# Patient Record
Sex: Male | Born: 1965 | Race: White | Hispanic: No | Marital: Married | Smoking: Never smoker
Health system: Southern US, Community
[De-identification: ages and names within clinical notes are randomized; demographics above are authoritative.]

## PROBLEM LIST (undated history)

## (undated) DIAGNOSIS — Z87442 Personal history of urinary calculi: Secondary | ICD-10-CM

## (undated) DIAGNOSIS — E119 Type 2 diabetes mellitus without complications: Secondary | ICD-10-CM

## (undated) DIAGNOSIS — J189 Pneumonia, unspecified organism: Secondary | ICD-10-CM

## (undated) DIAGNOSIS — M1990 Unspecified osteoarthritis, unspecified site: Secondary | ICD-10-CM

## (undated) DIAGNOSIS — R011 Cardiac murmur, unspecified: Secondary | ICD-10-CM

## (undated) DIAGNOSIS — K429 Umbilical hernia without obstruction or gangrene: Secondary | ICD-10-CM

## (undated) DIAGNOSIS — G4730 Sleep apnea, unspecified: Secondary | ICD-10-CM

## (undated) DIAGNOSIS — C801 Malignant (primary) neoplasm, unspecified: Secondary | ICD-10-CM

## (undated) HISTORY — PX: TONSILLECTOMY: SUR1361

## (undated) HISTORY — DX: Sleep apnea, unspecified: G47.30

## (undated) HISTORY — PX: OTHER SURGICAL HISTORY: SHX169

## (undated) HISTORY — PX: BACK SURGERY: SHX140

---

## 2000-08-11 ENCOUNTER — Encounter: Payer: Self-pay | Admitting: Emergency Medicine

## 2000-08-11 ENCOUNTER — Emergency Department (HOSPITAL_COMMUNITY): Admission: EM | Admit: 2000-08-11 | Discharge: 2000-08-11 | Payer: Self-pay | Admitting: Emergency Medicine

## 2000-10-23 ENCOUNTER — Encounter: Payer: Self-pay | Admitting: Emergency Medicine

## 2000-10-23 ENCOUNTER — Inpatient Hospital Stay (HOSPITAL_COMMUNITY): Admission: EM | Admit: 2000-10-23 | Discharge: 2000-10-24 | Payer: Self-pay | Admitting: Emergency Medicine

## 2000-10-24 ENCOUNTER — Encounter: Payer: Self-pay | Admitting: Cardiovascular Disease

## 2001-04-12 ENCOUNTER — Emergency Department (HOSPITAL_COMMUNITY): Admission: EM | Admit: 2001-04-12 | Discharge: 2001-04-12 | Payer: Self-pay | Admitting: Emergency Medicine

## 2001-08-06 ENCOUNTER — Ambulatory Visit (HOSPITAL_COMMUNITY): Admission: RE | Admit: 2001-08-06 | Discharge: 2001-08-06 | Payer: Self-pay | Admitting: Internal Medicine

## 2002-06-03 ENCOUNTER — Encounter: Payer: Self-pay | Admitting: Unknown Physician Specialty

## 2002-06-03 ENCOUNTER — Ambulatory Visit (HOSPITAL_COMMUNITY): Admission: RE | Admit: 2002-06-03 | Discharge: 2002-06-03 | Payer: Self-pay | Admitting: Unknown Physician Specialty

## 2002-11-13 ENCOUNTER — Ambulatory Visit: Admission: RE | Admit: 2002-11-13 | Discharge: 2002-11-13 | Payer: Self-pay | Admitting: Otolaryngology

## 2006-09-06 ENCOUNTER — Ambulatory Visit (HOSPITAL_COMMUNITY): Admission: RE | Admit: 2006-09-06 | Discharge: 2006-09-06 | Payer: Self-pay | Admitting: Obstetrics & Gynecology

## 2010-06-09 NOTE — Discharge Summary (Signed)
[facility]  Patient:    Brian Melton, Brian Melton Visit Number: [id number] MRN: [id number]          Service Type: MED Location: 2 A202 01 Attending Physician:  Fredirick Maudlin Dictated by:   Carylon Perches, M.D. Admit Date:  10/23/2000 Discharge Date: 10/24/2000   CC:         [name], M.D.  Kari Baars, M.D.   Discharge Summary  DISCHARGE DIAGNOSES: 1. Chest pain. 2. History of pneumothorax.  HOSPITAL COURSE:  This patient is a 45 year old white male who was admitted with chest pain.  His initial CPK was 101.  His troponin was 0.01.  His chest x-ray revealed minimal atelectasis in the left base.  He underwent a CT scan of the chest which revealed no evidence of pulmonary embolus.  He also had a CT scan of the lower extremities revealing no evidence of DVT.  Serial cardiac enzymes were normal.  He was seen in cardiology consultation by Dr. Alanda Amass. His pain was felt to be noncardiac in origin.  He was treated with Vioxx 25 mg q.d. and enteric-coated aspirin 325 mg q.d.  He was to have follow-up with Dr. Juanetta Gosling on an as needed basis. Dictated by:   Carylon Perches, M.D. Attending Physician:  Fredirick Maudlin DD:  02/08/01 TD:  02/10/01 Job: [id number]

## 2010-06-09 NOTE — H&P (Signed)
[facility]  Patient:    Brian Melton, Brian Melton Visit Number: [id number] MRN: [id number]          Service Type: MED Location: 2 A202 01 Attending Physician:  Fredirick Maudlin Dictated by:   [name], M.D. Admit Date:  10/23/2000 Discharge Date: 10/24/2000   CC:         Dr. Colon Flattery, [place], [place]  Dr. Shaune Pollack, [place], [place]   History and Physical  CHIEF COMPLAINT: This 45 year old gentleman has generally been healthy.  He developed a pleuritic type chest pain starting about 3 a.m. the morning of admission.  HISTORY OF PRESENT ILLNESS: He felt short of breath and weak.  The pain the day of admission became more pleuritic.  He noted that if he would be still the pain would not bother him but if he started to exercise it would bother him.  Two days prior to this he developed a clear rhinorrhea.  His three-year-old appears to have an upper respiratory infection.  PAST MEDICAL/SURGICAL HISTORY: He has had no surgery but he did have a spontaneous pneumothorax about 15 years prior to admission.  SOCIAL HISTORY: The patient does not smoke or drink.  He has used about a can of chewing tobacco every 1-1/2 days and has done this since high school days, about 20 years ago.  Currently married, [place].  His wife is a Engineer, civil (consulting). LMD is Dr. Colon Flattery in [place], [place].  REVIEW OF SYSTEMS: He denies fever and chills.  He has had some cough recently but has not coughed up any sputum.  Denies sore throat or earache, facial pain.  PHYSICAL EXAMINATION:  GENERAL: Admission examination showed a pleasant cooperative middle-aged male, who walked into the emergency room.  He is a good historian.  His color was good.  VITAL SIGNS: Temperature 98.8 degrees, pulse 81, respiratory rate 16, blood pressure 104/63.  O2 saturation was 98% on room air.  BP checked manually was 118/78.  Respiratory rate recheck was  16.  HEENT: TMs gray.  Good light reflex.  Pharynx mildly reddened in the posterior soft palate and uvula.  Negative ______.  No maxillary or frontal sinus pain. No axillary or subclavicular adenopathy.  LUNGS: Appeared clear throughout, moving air well.  No intercostal retractions or use of accessory muscles for respiration.  HEART: Regular rhythm without murmur, rate of 80.  ABDOMEN: Soft, obese, without hepatosplenomegaly or mass.  EXTREMITIES: No edema of the ankles.  LABORATORY DATA: WBC 7300 with 75% neutrophils, 13% lymphs, H&H 15.4/43.0; MCV 82; platelet count 223,000.  CK 101, CK-MB 1.4.  Troponin 0.01.  Blood gases on room air showed a pH of 7.43, pCO2 39, pO2 72, bicarbonate 26, O2 saturation 95%.  EKG showed normal sinus rhythm, rate of 81.  CT of the chest showed no pulmonary emboli, no PVT, no pleural effusion or pneumonia.  ADMISSION DIAGNOSES:  1. Probable pleurisy.  2. Chest pain with fatigue and decreased pO2.  3. 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  4. Exogenous obesity.  PLAN: Admit to hospital on a monitor.  He will be on O2 p.r.n. and will be on Levaquin IV.  Will arrange to have pulmonary and cardiology see him tomorrow. Hopefully he will be able to be discharged home within a day or two.  I am repeating EKG and enzymes in the morning. Dictated by:   [name], M.D. Attending Physician:  Fredirick Maudlin DD:  10/23/00 TD:  10/24/00 Job: [id number]

## 2013-03-11 ENCOUNTER — Emergency Department (HOSPITAL_COMMUNITY): Payer: Worker's Compensation

## 2013-03-11 ENCOUNTER — Emergency Department (HOSPITAL_COMMUNITY)
Admission: EM | Admit: 2013-03-11 | Discharge: 2013-03-11 | Disposition: A | Discharge status: Home / Self Care | Payer: Worker's Compensation | Attending: Emergency Medicine | Admitting: Emergency Medicine

## 2013-03-11 ENCOUNTER — Encounter (HOSPITAL_COMMUNITY): Payer: Self-pay | Admitting: Emergency Medicine

## 2013-03-11 DIAGNOSIS — S20219A Contusion of unspecified front wall of thorax, initial encounter: Secondary | ICD-10-CM | POA: Insufficient documentation

## 2013-03-11 DIAGNOSIS — E119 Type 2 diabetes mellitus without complications: Secondary | ICD-10-CM | POA: Insufficient documentation

## 2013-03-11 DIAGNOSIS — Y9289 Other specified places as the place of occurrence of the external cause: Secondary | ICD-10-CM | POA: Insufficient documentation

## 2013-03-11 DIAGNOSIS — Z880 Allergy status to penicillin: Secondary | ICD-10-CM | POA: Insufficient documentation

## 2013-03-11 DIAGNOSIS — W19XXXA Unspecified fall, initial encounter: Secondary | ICD-10-CM

## 2013-03-11 DIAGNOSIS — S4980XA Other specified injuries of shoulder and upper arm, unspecified arm, initial encounter: Secondary | ICD-10-CM | POA: Insufficient documentation

## 2013-03-11 DIAGNOSIS — Y9389 Activity, other specified: Secondary | ICD-10-CM | POA: Insufficient documentation

## 2013-03-11 DIAGNOSIS — Y990 Civilian activity done for income or pay: Secondary | ICD-10-CM | POA: Insufficient documentation

## 2013-03-11 DIAGNOSIS — Z791 Long term (current) use of non-steroidal anti-inflammatories (NSAID): Secondary | ICD-10-CM | POA: Insufficient documentation

## 2013-03-11 DIAGNOSIS — R296 Repeated falls: Secondary | ICD-10-CM | POA: Insufficient documentation

## 2013-03-11 DIAGNOSIS — S46909A Unspecified injury of unspecified muscle, fascia and tendon at shoulder and upper arm level, unspecified arm, initial encounter: Secondary | ICD-10-CM | POA: Insufficient documentation

## 2013-03-11 DIAGNOSIS — Z79899 Other long term (current) drug therapy: Secondary | ICD-10-CM | POA: Insufficient documentation

## 2013-03-11 HISTORY — DX: Type 2 diabetes mellitus without complications: E11.9

## 2013-03-11 MED ORDER — OXYCODONE-ACETAMINOPHEN 5-325 MG PO TABS: 1.0000 | ORAL_TABLET | ORAL | Status: DC | PRN

## 2013-03-11 MED ORDER — IBUPROFEN 800 MG PO TABS: 800.0000 mg | ORAL_TABLET | Freq: Three times a day (TID) | ORAL | Status: DC | PRN

## 2013-03-11 MED ORDER — OXYCODONE-ACETAMINOPHEN 5-325 MG PO TABS
1.0000 | ORAL_TABLET | Freq: Once | ORAL | Status: AC
  Administered 2013-03-11: 1 via ORAL
  Filled 2013-03-11: qty 1

## 2013-03-11 NOTE — Discharge Instructions (Signed)
Read the information below.  Use the prescribed medication as directed.  Please discuss all new medications with your pharmacist.  Do not take additional tylenol while taking the prescribed pain medication to avoid overdose.  You may return to the Emergency Department at any time for worsening condition or any new symptoms that concern you.  If you develop worsening chest pain, shortness of breath, severe cough, fever, you pass out, or become weak or dizzy, return to the ER for a recheck.    You have been diagnosed by your caregiver as having chest wall pain. SEEK IMMEDIATE MEDICAL ATTENTION IF: You develop a fever.  Your chest pains become severe or intolerable.  You develop new, unexplained symptoms (problems).  You develop shortness of breath, nausea, vomiting, sweating or feel light headed.  You develop a new cough or you cough up blood.   Chest Contusion A chest contusion is a deep bruise on your chest area. Contusions are the result of an injury that caused bleeding under the skin. A chest contusion may involve bruising of the skin, muscles, or ribs. The contusion may turn blue, purple, or yellow. Minor injuries will give you a painless contusion, but more severe contusions may stay painful and swollen for a few weeks. CAUSES  A contusion is usually caused by a blow, trauma, or direct force to an area of the body. SYMPTOMS   Swelling and redness of the injured area.  Discoloration of the injured area.  Tenderness and soreness of the injured area.  Pain. DIAGNOSIS  The diagnosis can be made by taking a history and performing a physical exam. An X-ray, CT scan, or MRI may be needed to determine if there were any associated injuries, such as broken bones (fractures) or internal injuries. TREATMENT  Often, the best treatment for a chest contusion is resting, icing, and applying cold compresses to the injured area. Deep breathing exercises may be recommended to reduce the risk of  pneumonia. Over-the-counter medicines may also be recommended for pain control. HOME CARE INSTRUCTIONS   Put ice on the injured area.  Put ice in a plastic bag.  Place a towel between your skin and the bag.  Leave the ice on for 15-20 minutes, 03-04 times a day.  Only take over-the-counter or prescription medicines as directed by your caregiver. Your caregiver may recommend avoiding anti-inflammatory medicines (aspirin, ibuprofen, and naproxen) for 48 hours because these medicines may increase bruising.  Rest the injured area.  Perform deep-breathing exercises as directed by your caregiver.  Stop smoking if you smoke.  Do not lift objects over 5 pounds (2.3 kg) for 3 days or longer if recommended by your caregiver. SEEK IMMEDIATE MEDICAL CARE IF:   You have increased bruising or swelling.  You have pain that is getting worse.  You have difficulty breathing.  You have dizziness, weakness, or fainting.  You have blood in your urine or stool.  You cough up or vomit blood.  Your swelling or pain is not relieved with medicines. MAKE SURE YOU:   Understand these instructions.  Will watch your condition.  Will get help right away if you are not doing well or get worse. Document Released: 10/03/2000 Document Revised: 10/03/2011 Document Reviewed: 07/02/2011 [facility] Patient Information 2014 [place].

## 2013-03-11 NOTE — ED Notes (Signed)
Pt fell from standing position while at work. C/o left shoulder pain but predominately left chest pain with movement.

## 2013-03-11 NOTE — ED Notes (Signed)
Pt transported to xray 

## 2013-03-11 NOTE — ED Provider Notes (Signed)
CSN: [id number]     Arrival date & time 03/11/13  1259 History  This chart was scribed for non-physician practitioner, Clayton Bibles, PA-C working with Hoy Morn, MD by Frederich Balding, ED scribe. This patient was seen in room TR08C/TR08C and the patient's care was started at 3:52 PM.    Chief Complaint  Patient presents with  . Fall   The history is provided by the patient. No language interpreter was used.   HPI Comments: Brian Melton is a 48 y.o. male who presents to the Emergency Department complaining of a fall that occurred earlier today while he was at work. He states he got caught in plastic and fell from a standing position. Denies hitting his head or LOC. He has sudden onset left chest wall pain and left shoulder pain. Movement and deep breathing worsen the pain.  Denies weakness or numbness of his arm.  Denies cough or hemoptysis.  Denies other injury or pain elsewhere, including his neck or back.    Past Medical History  Diagnosis Date  . Diabetes mellitus without complication    History reviewed. No pertinent past surgical history. No family history on file. History  Substance Use Topics  . Smoking status: Never Smoker   . Smokeless tobacco: Not on file  . Alcohol Use: No    Review of Systems  Respiratory: Negative for cough and shortness of breath.   Cardiovascular: Positive for chest pain.  Gastrointestinal: Negative for abdominal pain.  Musculoskeletal: Positive for arthralgias. Negative for back pain, neck pain and neck stiffness.  Skin: Negative for color change, pallor and wound.  Allergic/Immunologic: Negative for immunocompromised state.  Neurological: Negative for syncope, weakness, numbness and headaches.  Hematological: Does not bruise/bleed easily.  Psychiatric/Behavioral: Negative for confusion.  All other systems reviewed and are negative.   Allergies  Penicillins  Home Medications   Current Outpatient Rx  Name  Route  Sig  Dispense  Refill   . atorvastatin (LIPITOR) 10 MG tablet   Oral   Take 10 mg by mouth daily.         . Canagliflozin (INVOKANA) 300 MG TABS   Oral   Take 300 mg by mouth daily.         . fenofibrate micronized (ANTARA) 130 MG capsule   Oral   Take 130 mg by mouth daily before breakfast.         . glipiZIDE (GLUCOTROL XL) 5 MG 24 hr tablet   Oral   Take 5 mg by mouth daily with breakfast.         . meloxicam (MOBIC) 7.5 MG tablet   Oral   Take 7.5 mg by mouth 2 (two) times daily.         . metFORMIN (GLUCOPHAGE-XR) 500 MG 24 hr tablet   Oral   Take 1,000 mg by mouth 2 (two) times daily.         . Omega-3 Fatty Acids (FISH OIL) 1000 MG CAPS   Oral   Take 1 capsule by mouth 2 (two) times daily.          BP 134/86  Pulse 76  Temp(Src) 98.1 F (36.7 C) (Oral)  Resp 20  SpO2 93%  Physical Exam  Nursing note and vitals reviewed. Constitutional: He appears well-developed and well-nourished. No distress.  HENT:  Head: Normocephalic and atraumatic.  Neck: Neck supple.  Cardiovascular: Normal rate, regular rhythm and normal heart sounds.   Pulmonary/Chest: Effort normal and breath sounds normal. No  respiratory distress. He has no wheezes. He has no rales. He exhibits tenderness.  Left chest wall tenderness. No ecchymosis, skin changes or crepitus.   Musculoskeletal:  Spine nontender, no crepitus, or stepoffs. Non tender AC joint. No clavicular tenderness. No tenderness throughout left arm. 5/5 grip strength. Sensation intact. Radial pulses intact.   Neurological: He is alert.  Skin: He is not diaphoretic.  Psychiatric: He has a normal mood and affect. His behavior is normal.    ED Course  Procedures (including critical care time)  DIAGNOSTIC STUDIES: Oxygen Saturation is 93% on RA, adequate by my interpretation.    COORDINATION OF CARE: 3:57 PM-Discussed treatment plan which includes pain medication and taking deep breaths to expand his lungs with pt at bedside and pt  agreed to plan.   Labs Review Labs Reviewed - No data to display Imaging Review Dg Chest 2 View  03/11/2013   CLINICAL DATA:  Recent traumatic injury with pain  EXAM: CHEST  2 VIEW  COMPARISON:  None.  FINDINGS: Cardiac shadow is within normal limits. The lungs are well aerated bilaterally. No focal confluent infiltrate is seen. No acute bony abnormality is noted.  IMPRESSION: No active cardiopulmonary disease.   Electronically Signed   By: Inez Catalina M.D.   On: 03/11/2013 13:53   Dg Shoulder Left  03/11/2013   CLINICAL DATA:  Fall with left-sided shoulder pain.  EXAM: LEFT SHOULDER - 2+ VIEW  COMPARISON:  None.  FINDINGS: There is no evidence of fracture or dislocation. There is no evidence of arthropathy or other focal bone abnormality. Soft tissues are unremarkable.  IMPRESSION: Negative.   Electronically Signed   By: Aletta Edouard M.D.   On: 03/11/2013 13:50    EKG Interpretation   None       MDM   Final diagnoses:  Fall  Chest wall contusion   Pt with mechanical fall with pain and tenderness to left chest wall.  No LOC.  No SOB.  Pain is reproducible with movement of left arm, with deep breath, and especially with palpation.   Pt offered sling for comfort only, advised to use it only as needed.  No rib fractures identified on CXR.  Likely rib contusion.  Discussed pain control, deep inspiration and coughing throughout the day to prevent pneumonia.   D/C home with percocet, PCP follow up.  Discussed result, findings, treatment, and follow up  with patient.  Pt given return precautions.  Pt verbalizes understanding and agrees with plan.      I personally performed the services described in this documentation, which was scribed in my presence. The recorded information has been reviewed and is accurate.   [place], PA-C 03/11/13 [phone]

## 2013-03-11 NOTE — ED Notes (Signed)
Pt reports fell while at work, brought in by EMS. Pt c/o pain to left side and left shoulder. No LOC, no blood thinners.

## 2013-03-11 NOTE — ED Provider Notes (Signed)
Medical screening examination/treatment/procedure(s) were performed by non-physician practitioner and as supervising physician I was immediately available for consultation/collaboration.  EKG Interpretation   None         Hoy Morn, MD 03/11/13 [phone]

## 2013-10-16 ENCOUNTER — Emergency Department (HOSPITAL_COMMUNITY)
Admission: EM | Admit: 2013-10-16 | Discharge: 2013-10-16 | Disposition: A | Discharge status: Home / Self Care | Payer: BC Managed Care – PPO | Attending: Emergency Medicine | Admitting: Emergency Medicine

## 2013-10-16 ENCOUNTER — Emergency Department (HOSPITAL_COMMUNITY): Payer: BC Managed Care – PPO

## 2013-10-16 ENCOUNTER — Encounter (HOSPITAL_COMMUNITY): Payer: Self-pay | Admitting: Emergency Medicine

## 2013-10-16 DIAGNOSIS — N23 Unspecified renal colic: Secondary | ICD-10-CM

## 2013-10-16 DIAGNOSIS — Z791 Long term (current) use of non-steroidal anti-inflammatories (NSAID): Secondary | ICD-10-CM | POA: Insufficient documentation

## 2013-10-16 DIAGNOSIS — R109 Unspecified abdominal pain: Secondary | ICD-10-CM | POA: Insufficient documentation

## 2013-10-16 DIAGNOSIS — Z880 Allergy status to penicillin: Secondary | ICD-10-CM | POA: Insufficient documentation

## 2013-10-16 DIAGNOSIS — E119 Type 2 diabetes mellitus without complications: Secondary | ICD-10-CM | POA: Insufficient documentation

## 2013-10-16 DIAGNOSIS — Z79899 Other long term (current) drug therapy: Secondary | ICD-10-CM | POA: Insufficient documentation

## 2013-10-16 DIAGNOSIS — R112 Nausea with vomiting, unspecified: Secondary | ICD-10-CM | POA: Diagnosis not present

## 2013-10-16 DIAGNOSIS — R300 Dysuria: Secondary | ICD-10-CM | POA: Insufficient documentation

## 2013-10-16 LAB — URINALYSIS, ROUTINE W REFLEX MICROSCOPIC
Bilirubin Urine: NEGATIVE
Glucose, UA: NEGATIVE mg/dL
Ketones, ur: NEGATIVE mg/dL
Leukocytes, UA: NEGATIVE
NITRITE: NEGATIVE
PROTEIN: NEGATIVE mg/dL
SPECIFIC GRAVITY, URINE: 1.015 (ref 1.005–1.030)
Urobilinogen, UA: 0.2 mg/dL (ref 0.0–1.0)
pH: 5.5 (ref 5.0–8.0)

## 2013-10-16 LAB — URINE MICROSCOPIC-ADD ON

## 2013-10-16 MED ORDER — ONDANSETRON HCL 4 MG/2ML IJ SOLN
4.0000 mg | Freq: Once | INTRAMUSCULAR | Status: AC
  Administered 2013-10-16: 4 mg via INTRAVENOUS
  Filled 2013-10-16: qty 2

## 2013-10-16 MED ORDER — SODIUM CHLORIDE 0.9 % IV SOLN
1000.0000 mL | Freq: Once | INTRAVENOUS | Status: AC
  Administered 2013-10-16: 1000 mL via INTRAVENOUS

## 2013-10-16 MED ORDER — HYDROCODONE-ACETAMINOPHEN 5-325 MG PO TABS: 1.0000 | ORAL_TABLET | ORAL | Status: DC | PRN

## 2013-10-16 MED ORDER — KETOROLAC TROMETHAMINE 30 MG/ML IJ SOLN
30.0000 mg | Freq: Once | INTRAMUSCULAR | Status: AC
  Administered 2013-10-16: 30 mg via INTRAVENOUS
  Filled 2013-10-16: qty 1

## 2013-10-16 MED ORDER — ONDANSETRON HCL 4 MG PO TABS: 4.0000 mg | ORAL_TABLET | Freq: Four times a day (QID) | ORAL | Status: DC

## 2013-10-16 MED ORDER — ONDANSETRON HCL 4 MG/2ML IJ SOLN: 4.0000 mg | Freq: Once | INTRAMUSCULAR | Status: DC

## 2013-10-16 MED ORDER — SODIUM CHLORIDE 0.9 % IV SOLN: 1000.0000 mL | INTRAVENOUS | Status: DC

## 2013-10-16 NOTE — ED Provider Notes (Signed)
CSN: [id number]     Arrival date & time 10/16/13  7829 History   First MD Initiated Contact with Patient 10/16/13 [phone]     Chief Complaint  Patient presents with  . Flank Pain     (Consider location/radiation/quality/duration/timing/severity/associated sxs/prior Treatment) Patient is a 48 y.o. male presenting with flank pain. The history is provided by the patient. No language interpreter was used.  Flank Pain This is a new problem. The current episode started today. Associated symptoms include abdominal pain, nausea and vomiting. Pertinent negatives include no chest pain, chills, coughing, fever or myalgias. Associated symptoms comments: Left flank pain that started this morning similar to history of kidney stones. Last stone was about 20 years ago which he was able to pass without intervention. He has nausea without vomiting. No fever. He denies hematuria. He reports some burning with urination last night, none today..    Past Medical History  Diagnosis Date  . Diabetes mellitus without complication    History reviewed. No pertinent past surgical history. No family history on file. History  Substance Use Topics  . Smoking status: Never Smoker   . Smokeless tobacco: Not on file  . Alcohol Use: No    Review of Systems  Constitutional: Negative for fever and chills.  Respiratory: Negative.  Negative for cough.   Cardiovascular: Negative.  Negative for chest pain.  Gastrointestinal: Positive for nausea, vomiting and abdominal pain.  Genitourinary: Positive for dysuria and flank pain. Negative for hematuria.  Musculoskeletal: Negative.  Negative for myalgias.  Skin: Negative.  Negative for color change.  Neurological: Negative.       Allergies  Penicillins  Home Medications   Prior to Admission medications   Medication Sig Start Date End Date Taking? Authorizing Provider  atorvastatin (LIPITOR) 10 MG tablet Take 10 mg by mouth daily.   Yes Historical Provider, MD   fenofibrate micronized (ANTARA) 130 MG capsule Take 130 mg by mouth daily before breakfast.   Yes Historical Provider, MD  glipiZIDE (GLUCOTROL XL) 5 MG 24 hr tablet Take 5 mg by mouth daily with breakfast.   Yes Historical Provider, MD  ibuprofen (ADVIL,MOTRIN) 200 MG tablet Take 600 mg by mouth daily as needed for moderate pain.   Yes Historical Provider, MD  lisinopril (PRINIVIL,ZESTRIL) 10 MG tablet Take 1 tablet by mouth daily. 10/03/13  Yes Historical Provider, MD  meloxicam (MOBIC) 7.5 MG tablet Take 7.5 mg by mouth 2 (two) times daily.   Yes Historical Provider, MD  metFORMIN (GLUCOPHAGE-XR) 500 MG 24 hr tablet Take 1,000 mg by mouth 2 (two) times daily.   Yes Historical Provider, MD  Omega-3 Fatty Acids (FISH OIL) 1000 MG CAPS Take 1 capsule by mouth 2 (two) times daily.   Yes Historical Provider, MD   BP 141/97  Pulse 64  Temp(Src) 97.6 F (36.4 C) (Oral)  Resp 18  SpO2 100% Physical Exam  Constitutional: He is oriented to person, place, and time. He appears well-developed and well-nourished. No distress.  HENT:  Head: Normocephalic.  Neck: Normal range of motion. Neck supple.  Cardiovascular: Normal rate and regular rhythm.   Pulmonary/Chest: Effort normal and breath sounds normal.  Abdominal: Soft. Bowel sounds are normal. There is no tenderness. There is no rebound and no guarding.  Genitourinary:  No CVA tenderness.   Musculoskeletal: Normal range of motion.  Neurological: He is alert and oriented to person, place, and time.  Skin: Skin is warm and dry. No rash noted.  Psychiatric: He has a normal  mood and affect.    ED Course  Procedures (including critical care time) Labs Review Labs Reviewed  URINALYSIS, ROUTINE W REFLEX MICROSCOPIC - Abnormal; Notable for the following:    Hgb urine dipstick TRACE (*)    All other components within normal limits  URINE MICROSCOPIC-ADD ON    Imaging Review No results found.   EKG Interpretation None      MDM   Final  diagnoses:  None    1. Ureteral colic  No stone visualized on CT, however, there is stranding suggesting passed stone. He is pain free now with Toradol. No further nausea. No evidence of UTI, doubt pyelonephritis. He is well appearing, afebrile. Discussed return precautions.     Dewaine Oats, PA-C 10/16/13 1123

## 2013-10-16 NOTE — ED Provider Notes (Signed)
Medical screening examination/treatment/procedure(s) were performed by non-physician practitioner and as supervising physician I was immediately available for consultation/collaboration.   EKG Interpretation None        Orpah Greek, MD 10/16/13 1139

## 2013-10-16 NOTE — ED Notes (Signed)
PA at bedside.

## 2013-10-16 NOTE — ED Notes (Signed)
Left sided flank pain that began this am along with nausea.

## 2013-10-16 NOTE — Discharge Instructions (Signed)

## 2015-10-31 ENCOUNTER — Encounter (INDEPENDENT_AMBULATORY_CARE_PROVIDER_SITE_OTHER): Payer: Self-pay | Admitting: Internal Medicine

## 2015-11-10 ENCOUNTER — Ambulatory Visit (INDEPENDENT_AMBULATORY_CARE_PROVIDER_SITE_OTHER): Payer: BLUE CROSS/BLUE SHIELD | Admitting: Internal Medicine

## 2015-11-10 ENCOUNTER — Encounter (INDEPENDENT_AMBULATORY_CARE_PROVIDER_SITE_OTHER): Payer: Self-pay | Admitting: *Deleted

## 2015-11-10 ENCOUNTER — Other Ambulatory Visit (INDEPENDENT_AMBULATORY_CARE_PROVIDER_SITE_OTHER): Payer: Self-pay | Admitting: Internal Medicine

## 2015-11-10 ENCOUNTER — Telehealth (INDEPENDENT_AMBULATORY_CARE_PROVIDER_SITE_OTHER): Payer: Self-pay | Admitting: *Deleted

## 2015-11-10 ENCOUNTER — Encounter (INDEPENDENT_AMBULATORY_CARE_PROVIDER_SITE_OTHER): Payer: Self-pay | Admitting: Internal Medicine

## 2015-11-10 ENCOUNTER — Encounter (INDEPENDENT_AMBULATORY_CARE_PROVIDER_SITE_OTHER): Payer: Self-pay

## 2015-11-10 VITALS — BP 140/90 | HR 68 | Temp 97.9°F | Ht 70.0 in | Wt 248.0 lb

## 2015-11-10 DIAGNOSIS — R195 Other fecal abnormalities: Secondary | ICD-10-CM | POA: Insufficient documentation

## 2015-11-10 DIAGNOSIS — G4730 Sleep apnea, unspecified: Secondary | ICD-10-CM | POA: Insufficient documentation

## 2015-11-10 DIAGNOSIS — E119 Type 2 diabetes mellitus without complications: Secondary | ICD-10-CM | POA: Insufficient documentation

## 2015-11-10 MED ORDER — PEG 3350-KCL-NA BICARB-NACL 420 G PO SOLR: 4000.0000 mL | Freq: Once | ORAL | 0 refills | Status: AC

## 2015-11-10 NOTE — Patient Instructions (Signed)
Colonoscopy.  The risks and benefits such as perforation, bleeding, and infection were reviewed with the patient and is agreeable. 

## 2015-11-10 NOTE — Telephone Encounter (Signed)
Patient needs trilyte 

## 2015-11-10 NOTE — Progress Notes (Signed)
   Subjective:    Patient ID: Brian Melton, male    DOB: 01/27/1965, 50 y.o.   MRN: [id number]  HPI Referred by Dr. Edrick Oh for + FOB. Occasionally see [facility].  There has been weight loss which was intentional. Appetite is good. No abdominal pain.  States he has a hernia. No acid reflux. Has a BM 1-2 a day.  Metformin gives him diarrhea. Really no change in his stools.   08/07/2007 colonoscopy: elective. Post prandial diarrhea consistent with IBS: Singlesmall polyp in the sigmoid colon. Remove by snare cautery. Small external hemorrhoids were present. Biopsy: Inflammatory polyp  Diabetic x 11 yrs.  09/27/2015 H and H 14.7 and 42.7, MCV 90 Total bili 0.8, ALP 108, AST 17, ALT 28  Review of Systems Past Medical History:  Diagnosis Date  . Diabetes mellitus without complication ([place])   . Sleep apnea     Past Surgical History:  Procedure Laterality Date  . ear drum surgery     infant.  . TONSILLECTOMY      Allergies  Allergen Reactions  . Penicillins     Childhood reaction.    Current Outpatient Prescriptions on File Prior to Visit  Medication Sig Dispense Refill  . atorvastatin (LIPITOR) 10 MG tablet Take 5 mg by mouth daily.     Marland Kitchen glipiZIDE (GLUCOTROL XL) 5 MG 24 hr tablet Take 5 mg by mouth 2 (two) times daily.     Marland Kitchen ibuprofen (ADVIL,MOTRIN) 200 MG tablet Take 600 mg by mouth daily as needed for moderate pain.    . meloxicam (MOBIC) 7.5 MG tablet Take 7.5 mg by mouth 2 (two) times daily.    . metFORMIN (GLUCOPHAGE-XR) 500 MG 24 hr tablet Take 1,000 mg by mouth 2 (two) times daily.     No current facility-administered medications on file prior to visit.        Objective:   Physical Exam Blood pressure 140/90, pulse 68, temperature 97.9 F (36.6 C), height 5\' 10"  (1.778 m), weight 248 lb (112.5 kg).  Alert and oriented. Skin warm and dry. Oral mucosa is moist.   . Sclera anicteric, conjunctivae is pink. Thyroid not enlarged. No cervical lymphadenopathy. Lungs clear.  Heart regular rate and rhythm.  Abdomen is soft. Bowel sounds are positive. No hepatomegaly. No abdominal masses felt. No tenderness.  No edema to lower extremities.  .       Assessment & Plan:  +FOB. Colonic neoplasm needs to be ruled out.  Hemorrhoids, polyp, AVM, ulcer also in the differential.

## 2015-11-22 ENCOUNTER — Encounter (HOSPITAL_COMMUNITY): Payer: Self-pay

## 2015-11-22 ENCOUNTER — Ambulatory Visit (HOSPITAL_COMMUNITY)
Admission: RE | Admit: 2015-11-22 | Discharge: 2015-11-22 | Disposition: A | Discharge status: Home / Self Care | Payer: BLUE CROSS/BLUE SHIELD | Source: Ambulatory Visit | Attending: Internal Medicine | Admitting: Internal Medicine

## 2015-11-22 ENCOUNTER — Encounter (HOSPITAL_COMMUNITY)
Admission: RE | Disposition: A | Discharge status: Home / Self Care | Payer: Self-pay | Source: Ambulatory Visit | Attending: Internal Medicine

## 2015-11-22 DIAGNOSIS — G4730 Sleep apnea, unspecified: Secondary | ICD-10-CM | POA: Diagnosis not present

## 2015-11-22 DIAGNOSIS — E119 Type 2 diabetes mellitus without complications: Secondary | ICD-10-CM | POA: Diagnosis not present

## 2015-11-22 DIAGNOSIS — K644 Residual hemorrhoidal skin tags: Secondary | ICD-10-CM | POA: Insufficient documentation

## 2015-11-22 DIAGNOSIS — R195 Other fecal abnormalities: Secondary | ICD-10-CM | POA: Insufficient documentation

## 2015-11-22 DIAGNOSIS — F17290 Nicotine dependence, other tobacco product, uncomplicated: Secondary | ICD-10-CM | POA: Insufficient documentation

## 2015-11-22 DIAGNOSIS — K921 Melena: Secondary | ICD-10-CM | POA: Diagnosis present

## 2015-11-22 DIAGNOSIS — Z880 Allergy status to penicillin: Secondary | ICD-10-CM | POA: Insufficient documentation

## 2015-11-22 DIAGNOSIS — Z7984 Long term (current) use of oral hypoglycemic drugs: Secondary | ICD-10-CM | POA: Diagnosis not present

## 2015-11-22 HISTORY — PX: COLONOSCOPY: SHX5424

## 2015-11-22 LAB — GLUCOSE, CAPILLARY: Glucose-Capillary: 143 mg/dL — ABNORMAL HIGH (ref 65–99)

## 2015-11-22 SURGERY — COLONOSCOPY
Anesthesia: Moderate Sedation

## 2015-11-22 MED ORDER — MIDAZOLAM HCL 5 MG/5ML IJ SOLN
INTRAMUSCULAR | Status: AC
  Filled 2015-11-22: qty 10

## 2015-11-22 MED ORDER — MEPERIDINE HCL 50 MG/ML IJ SOLN
INTRAMUSCULAR | Status: DC | PRN
  Administered 2015-11-22 (×2): 25 mg via INTRAVENOUS

## 2015-11-22 MED ORDER — MEPERIDINE HCL 50 MG/ML IJ SOLN
INTRAMUSCULAR | Status: AC
  Filled 2015-11-22: qty 1

## 2015-11-22 MED ORDER — MIDAZOLAM HCL 5 MG/5ML IJ SOLN
INTRAMUSCULAR | Status: DC | PRN
Start: 2015-11-22 — End: 2015-11-22
  Administered 2015-11-22 (×3): 2 mg via INTRAVENOUS

## 2015-11-22 MED ORDER — STERILE WATER FOR IRRIGATION IR SOLN
Status: DC | PRN
  Administered 2015-11-22: 2.5 mL

## 2015-11-22 NOTE — Op Note (Signed)
[facility] Patient Name: Brian Melton Procedure Date: 11/22/2015 12:58 PM MRN: [id number] Date of Birth: [birth year] Attending MD: Hildred Laser , MD CSN: [id number] Age: 51 Admit Type: Outpatient Procedure:                Colonoscopy Indications:              Heme positive stool Providers:                Hildred Laser, MD, Lurline Del, RN, Charlyne Petrin                            RN, RN, Purcell Nails. Rawlins, Technician Referring MD:             Sherrie Mustache, MD Medicines:                Meperidine 50 mg IV, Midazolam 6 mg IV Complications:            No immediate complications. Estimated Blood Loss:     Estimated blood loss: none. Procedure:                Pre-Anesthesia Assessment:                           - Prior to the procedure, a History and Physical                            was performed, and patient medications and                            allergies were reviewed. The patient's tolerance of                            previous anesthesia was also reviewed. The risks                            and benefits of the procedure and the sedation                            options and risks were discussed with the patient.                            All questions were answered, and informed consent                            was obtained. Prior Anticoagulants: The patient                            last took previous NSAID medication 1 day prior to                            the procedure. ASA Grade Assessment: II - A patient                            with mild systemic disease. After reviewing the  risks and benefits, the patient was deemed in                            satisfactory condition to undergo the procedure.                           After obtaining informed consent, the colonoscope                            was passed under direct vision. Throughout the                            procedure, the patient's blood pressure, pulse,  and                            oxygen saturations were monitored continuously. The                            [id number] scope was introduced through                            the anus and advanced to the the cecum, identified                            by appendiceal orifice and ileocecal valve. The                            colonoscopy was performed without difficulty. The                            patient tolerated the procedure well. The quality                            of the bowel preparation was adequate to identify                            polyps 6 mm and larger in size. The ileocecal                            valve, appendiceal orifice, and rectum were                            photographed. Scope In: 1:11:37 PM Scope Out: 1:25:53 PM Scope Withdrawal Time: 0 hours 8 minutes 56 seconds  Total Procedure Duration: 0 hours 14 minutes 16 seconds  Findings:      The colon (entire examined portion) appeared normal.      External hemorrhoids were found during retroflexion. The hemorrhoids       were small. Impression:               - The entire examined colon is normal.                           - External hemorrhoids.                           -  No specimens collected. Moderate Sedation:      Moderate (conscious) sedation was administered by the endoscopy nurse       and supervised by the endoscopist. The following parameters were       monitored: oxygen saturation, heart rate, blood pressure, CO2       capnography and response to care. Total physician intraservice time was       23 minutes. Recommendation:           - Patient has a contact number available for                            emergencies. The signs and symptoms of potential                            delayed complications were discussed with the                            patient. Return to normal activities tomorrow.                            Written discharge instructions were provided to the                             patient.                           - Resume previous diet today.                           - Continue present medications.                           - Repeat colonoscopy in 10 years for screening                            purposes.                           - Patient advised to have H&H in 3-4 months.                           - Patient advised not to take ibuprofen while he is                            on meloxicam. Procedure Code(s):        --- Professional ---                           [phone], Colonoscopy, flexible; diagnostic, including                            collection of specimen(s) by brushing or washing,                            when performed (separate procedure)  [id number], Moderate sedation services provided by the                            same physician or other qualified health care                            professional performing the diagnostic or                            therapeutic service that the sedation supports,                            requiring the presence of an independent trained                            observer to assist in the monitoring of the                            patient's level of consciousness and physiological                            status; initial 15 minutes of intraservice time,                            patient age 55 years or older                           [phone], Moderate sedation services; each additional                            15 minutes intraservice time Diagnosis Code(s):        --- Professional ---                           K64.4, Residual hemorrhoidal skin tags                           R19.5, Other fecal abnormalities CPT copyright 2016 American Medical Association. All rights reserved. The codes documented in this report are preliminary and upon coder review may  be revised to meet current compliance requirements. Hildred Laser, MD Hildred Laser, MD 11/22/2015 1:34:25  PM This report has been signed electronically. Number of Addenda: 0

## 2015-11-22 NOTE — H&P (Signed)
Brian Melton is an 50 y.o. male.   Chief Complaint: Patient is here for colonoscopy. HPI: Patient is 50 year old Caucasian male who was noted to have heme-positive stool and is here for diagnostic colonoscopy. He denies abdominal pain change in bowel habits or rectal bleeding. He is on meloxicam and occasionally takes Advil on top of that with has not experienced any side effects such as epigastric pain nausea or vomiting. He also denies heartburn or dysphagia. 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 Family History is negative for CRC.  Past Medical History:  Diagnosis Date  . Diabetes mellitus without complication ([place])   . Sleep apnea     Past Surgical History:  Procedure Laterality Date  . ear drum surgery     infant.  . TONSILLECTOMY      History reviewed. No pertinent family history. Social History:  reports that he has never smoked. His smokeless tobacco use includes Chew. He reports that he does not drink alcohol or use drugs.  Allergies:  Allergies  Allergen Reactions  . Penicillins     Childhood reaction. Has patient had a PCN reaction causing immediate rash, facial/tongue/throat swelling, SOB or lightheadedness with hypotension: No Has patient had a PCN reaction causing severe rash involving mucus membranes or skin necrosis: No Has patient had a PCN reaction that required hospitalization No Has patient had a PCN reaction occurring within the last 10 years: No If all of the above answers are "NO", then may proceed with Cephalosporin use.     Medications Prior to Admission  Medication Sig Dispense Refill  . atorvastatin (LIPITOR) 10 MG tablet Take 5 mg by mouth daily.     . canagliflozin (INVOKANA) 300 MG TABS tablet Take 300 mg by mouth daily before breakfast.    . glipiZIDE (GLUCOTROL XL) 5 MG 24 hr tablet Take 5 mg by mouth 2 (two) times daily.     Marland Kitchen ibuprofen  (ADVIL,MOTRIN) 200 MG tablet Take 600 mg by mouth daily as needed for moderate pain.    Marland Kitchen losartan (COZAAR) 50 MG tablet Take 50 mg by mouth daily.    . meloxicam (MOBIC) 7.5 MG tablet Take 7.5 mg by mouth 2 (two) times daily.    . metFORMIN (GLUCOPHAGE-XR) 500 MG 24 hr tablet Take 1,000 mg by mouth 2 (two) times daily.    . Omega-3 Fatty Acids (FISH OIL) 1200 MG CAPS Take 1,200 mg by mouth daily.       Results for orders placed or performed during the hospital encounter of 11/22/15 (from the past 48 hour(s))  Glucose, capillary     Status: Abnormal   Collection Time: 11/22/15 11:16 AM  Result Value Ref Range   Glucose-Capillary 143 (H) 65 - 99 mg/dL   No results found.  ROS  Blood pressure 125/79, pulse 65, temperature 97.7 F (36.5 C), resp. rate 16, SpO2 97 %. Physical Exam  Constitutional: He appears well-developed and well-nourished.  HENT:  Mouth/Throat: Oropharynx is clear and moist.  Eyes: Conjunctivae are normal. No scleral icterus.  Neck: No thyromegaly present.  Cardiovascular: Normal rate, regular rhythm and normal heart sounds.   No murmur heard. Respiratory: Effort normal and breath sounds normal.  GI: Soft. He exhibits no distension and no mass. There is no tenderness.  Musculoskeletal: He exhibits no edema.  Lymphadenopathy:    He has no cervical adenopathy.  Neurological: He is alert.  Skin: Skin is warm  and dry.     Assessment/Plan Heme-positive stool. Diagnostic colonoscopy.  Hildred Laser, MD 11/22/2015, 1:00 PM

## 2015-11-22 NOTE — Discharge Instructions (Signed)
Remember not to take Advil or ibuprofen while on meloxicam. Can take Tylenol up to 2 g per day as needed. Resume other medications as before. Resume usual diet. No driving for 24 hours. Please have Dr. Edrick Oh check hemoglobin and hematocrit in 3-4 months.   Hemorrhoids Hemorrhoids are swollen veins around the rectum or anus. There are two types of hemorrhoids:   Internal hemorrhoids. These occur in the veins just inside the rectum. They may poke through to the outside and become irritated and painful.  External hemorrhoids. These occur in the veins outside the anus and can be felt as a painful swelling or hard lump near the anus. CAUSES  Pregnancy.   Obesity.   Constipation or diarrhea.   Straining to have a bowel movement.   Sitting for long periods on the toilet.  Heavy lifting or other activity that caused you to strain.  Anal intercourse. SYMPTOMS   Pain.   Anal itching or irritation.   Rectal bleeding.   Fecal leakage.   Anal swelling.   One or more lumps around the anus.  DIAGNOSIS  Your caregiver may be able to diagnose hemorrhoids by visual examination. Other examinations or tests that may be performed include:   Examination of the rectal area with a gloved hand (digital rectal exam).   Examination of anal canal using a small tube (scope).   A blood test if you have lost a significant amount of blood.  A test to look inside the colon (sigmoidoscopy or colonoscopy). TREATMENT Most hemorrhoids can be treated at home. However, if symptoms do not seem to be getting better or if you have a lot of rectal bleeding, your caregiver may perform a procedure to help make the hemorrhoids get smaller or remove them completely. Possible treatments include:   Placing a rubber band at the base of the hemorrhoid to cut off the circulation (rubber band ligation).   Injecting a chemical to shrink the hemorrhoid (sclerotherapy).   Using a tool to burn the  hemorrhoid (infrared light therapy).   Surgically removing the hemorrhoid (hemorrhoidectomy).   Stapling the hemorrhoid to block blood flow to the tissue (hemorrhoid stapling).  HOME CARE INSTRUCTIONS   Eat foods with fiber, such as whole grains, beans, nuts, fruits, and vegetables. Ask your doctor about taking products with added fiber in them (fibersupplements).  Increase fluid intake. Drink enough water and fluids to keep your urine clear or pale yellow.   Exercise regularly.   Go to the bathroom when you have the urge to have a bowel movement. Do not wait.   Avoid straining to have bowel movements.   Keep the anal area dry and clean. Use wet toilet paper or moist towelettes after a bowel movement.   Medicated creams and suppositories may be used or applied as directed.   Only take over-the-counter or prescription medicines as directed by your caregiver.   Take warm sitz baths for 15-20 minutes, 3-4 times a day to ease pain and discomfort.   Place ice packs on the hemorrhoids if they are tender and swollen. Using ice packs between sitz baths may be helpful.   Put ice in a plastic bag.   Place a towel between your skin and the bag.   Leave the ice on for 15-20 minutes, 3-4 times a day.   Do not use a donut-shaped pillow or sit on the toilet for long periods. This increases blood pooling and pain.  SEEK MEDICAL CARE IF:  You have increasing pain  and swelling that is not controlled by treatment or medicine.  You have uncontrolled bleeding.  You have difficulty or you are unable to have a bowel movement.  You have pain or inflammation outside the area of the hemorrhoids. MAKE SURE YOU:  Understand these instructions.  Will watch your condition.  Will get help right away if you are not doing well or get worse.   This information is not intended to replace advice given to you by your health care provider. Make sure you discuss any questions you have  with your health care provider.   Document Released: 01/06/2000 Document Revised: 12/26/2011 Document Reviewed: 11/13/2011 Elsevier Interactive Patient Education [employer].

## 2015-11-22 NOTE — Progress Notes (Signed)
Brian Melton had a procedure at [facility] on 11/22/15 and cannot return to work until 3:00pm on 11/23/15.

## 2015-11-23 ENCOUNTER — Encounter (HOSPITAL_COMMUNITY): Payer: Self-pay | Admitting: Internal Medicine

## 2017-03-25 DIAGNOSIS — E785 Hyperlipidemia, unspecified: Secondary | ICD-10-CM | POA: Diagnosis not present

## 2017-03-25 DIAGNOSIS — E119 Type 2 diabetes mellitus without complications: Secondary | ICD-10-CM | POA: Diagnosis not present

## 2017-03-25 DIAGNOSIS — R799 Abnormal finding of blood chemistry, unspecified: Secondary | ICD-10-CM | POA: Diagnosis not present

## 2017-04-01 DIAGNOSIS — M1990 Unspecified osteoarthritis, unspecified site: Secondary | ICD-10-CM | POA: Diagnosis not present

## 2017-04-01 DIAGNOSIS — E1169 Type 2 diabetes mellitus with other specified complication: Secondary | ICD-10-CM | POA: Diagnosis not present

## 2017-04-01 DIAGNOSIS — E1159 Type 2 diabetes mellitus with other circulatory complications: Secondary | ICD-10-CM | POA: Diagnosis not present

## 2017-04-01 DIAGNOSIS — E669 Obesity, unspecified: Secondary | ICD-10-CM | POA: Diagnosis not present

## 2017-04-01 DIAGNOSIS — E785 Hyperlipidemia, unspecified: Secondary | ICD-10-CM | POA: Diagnosis not present

## 2017-04-01 DIAGNOSIS — I10 Essential (primary) hypertension: Secondary | ICD-10-CM | POA: Diagnosis not present

## 2017-06-24 DIAGNOSIS — E1159 Type 2 diabetes mellitus with other circulatory complications: Secondary | ICD-10-CM | POA: Diagnosis not present

## 2017-06-24 DIAGNOSIS — I10 Essential (primary) hypertension: Secondary | ICD-10-CM | POA: Diagnosis not present

## 2017-07-01 DIAGNOSIS — E1169 Type 2 diabetes mellitus with other specified complication: Secondary | ICD-10-CM | POA: Diagnosis not present

## 2017-07-01 DIAGNOSIS — I10 Essential (primary) hypertension: Secondary | ICD-10-CM | POA: Diagnosis not present

## 2017-07-01 DIAGNOSIS — M1990 Unspecified osteoarthritis, unspecified site: Secondary | ICD-10-CM | POA: Diagnosis not present

## 2017-07-01 DIAGNOSIS — E669 Obesity, unspecified: Secondary | ICD-10-CM | POA: Diagnosis not present

## 2017-07-01 DIAGNOSIS — E1159 Type 2 diabetes mellitus with other circulatory complications: Secondary | ICD-10-CM | POA: Diagnosis not present

## 2017-07-12 DIAGNOSIS — E119 Type 2 diabetes mellitus without complications: Secondary | ICD-10-CM | POA: Diagnosis not present

## 2017-07-12 DIAGNOSIS — H2513 Age-related nuclear cataract, bilateral: Secondary | ICD-10-CM | POA: Diagnosis not present

## 2017-07-12 DIAGNOSIS — H11153 Pinguecula, bilateral: Secondary | ICD-10-CM | POA: Diagnosis not present

## 2017-09-02 DIAGNOSIS — I10 Essential (primary) hypertension: Secondary | ICD-10-CM | POA: Diagnosis not present

## 2017-09-02 DIAGNOSIS — E1159 Type 2 diabetes mellitus with other circulatory complications: Secondary | ICD-10-CM | POA: Diagnosis not present

## 2017-09-09 DIAGNOSIS — E1169 Type 2 diabetes mellitus with other specified complication: Secondary | ICD-10-CM | POA: Diagnosis not present

## 2017-09-09 DIAGNOSIS — M1990 Unspecified osteoarthritis, unspecified site: Secondary | ICD-10-CM | POA: Diagnosis not present

## 2017-09-09 DIAGNOSIS — E119 Type 2 diabetes mellitus without complications: Secondary | ICD-10-CM | POA: Diagnosis not present

## 2017-09-09 DIAGNOSIS — I10 Essential (primary) hypertension: Secondary | ICD-10-CM | POA: Diagnosis not present

## 2017-09-09 DIAGNOSIS — E1165 Type 2 diabetes mellitus with hyperglycemia: Secondary | ICD-10-CM | POA: Diagnosis not present

## 2017-09-09 DIAGNOSIS — E1159 Type 2 diabetes mellitus with other circulatory complications: Secondary | ICD-10-CM | POA: Diagnosis not present

## 2017-09-23 ENCOUNTER — Encounter (HOSPITAL_COMMUNITY): Payer: Self-pay | Admitting: Emergency Medicine

## 2017-09-23 ENCOUNTER — Emergency Department (HOSPITAL_COMMUNITY): Payer: BLUE CROSS/BLUE SHIELD

## 2017-09-23 ENCOUNTER — Emergency Department (HOSPITAL_COMMUNITY)
Admission: EM | Admit: 2017-09-23 | Discharge: 2017-09-23 | Disposition: A | Discharge status: Home / Self Care | Payer: BLUE CROSS/BLUE SHIELD | Attending: Emergency Medicine | Admitting: Emergency Medicine

## 2017-09-23 DIAGNOSIS — Z79899 Other long term (current) drug therapy: Secondary | ICD-10-CM | POA: Diagnosis not present

## 2017-09-23 DIAGNOSIS — M5432 Sciatica, left side: Secondary | ICD-10-CM | POA: Diagnosis not present

## 2017-09-23 DIAGNOSIS — Z7984 Long term (current) use of oral hypoglycemic drugs: Secondary | ICD-10-CM | POA: Insufficient documentation

## 2017-09-23 DIAGNOSIS — M25562 Pain in left knee: Secondary | ICD-10-CM | POA: Diagnosis not present

## 2017-09-23 DIAGNOSIS — M5442 Lumbago with sciatica, left side: Secondary | ICD-10-CM | POA: Diagnosis not present

## 2017-09-23 DIAGNOSIS — F17220 Nicotine dependence, chewing tobacco, uncomplicated: Secondary | ICD-10-CM | POA: Insufficient documentation

## 2017-09-23 DIAGNOSIS — E119 Type 2 diabetes mellitus without complications: Secondary | ICD-10-CM | POA: Diagnosis not present

## 2017-09-23 DIAGNOSIS — M25552 Pain in left hip: Secondary | ICD-10-CM | POA: Diagnosis not present

## 2017-09-23 LAB — CBC WITH DIFFERENTIAL/PLATELET
Abs Immature Granulocytes: 0.1 10*3/uL (ref 0.0–0.1)
BASOS ABS: 0.1 10*3/uL (ref 0.0–0.1)
Basophils Relative: 1 %
EOS PCT: 4 %
Eosinophils Absolute: 0.3 10*3/uL (ref 0.0–0.7)
HCT: 45.1 % (ref 39.0–52.0)
HEMOGLOBIN: 15.3 g/dL (ref 13.0–17.0)
Immature Granulocytes: 1 %
LYMPHS PCT: 19 %
Lymphs Abs: 1.5 10*3/uL (ref 0.7–4.0)
MCH: 30.4 pg (ref 26.0–34.0)
MCHC: 33.9 g/dL (ref 30.0–36.0)
MCV: 89.5 fL (ref 78.0–100.0)
MONO ABS: 0.8 10*3/uL (ref 0.1–1.0)
Monocytes Relative: 10 %
Neutro Abs: 5.2 10*3/uL (ref 1.7–7.7)
Neutrophils Relative %: 65 %
Platelets: 193 10*3/uL (ref 150–400)
RBC: 5.04 MIL/uL (ref 4.22–5.81)
RDW: 13.1 % (ref 11.5–15.5)
WBC: 7.9 10*3/uL (ref 4.0–10.5)

## 2017-09-23 LAB — BASIC METABOLIC PANEL
Anion gap: 11 (ref 5–15)
BUN: 12 mg/dL (ref 6–20)
CHLORIDE: 105 mmol/L (ref 98–111)
CO2: 22 mmol/L (ref 22–32)
CREATININE: 0.66 mg/dL (ref 0.61–1.24)
Calcium: 9.1 mg/dL (ref 8.9–10.3)
GFR calc Af Amer: >60 mL/min (ref >60)
GFR calc non Af Amer: >60 mL/min (ref >60)
Glucose, Bld: 184 mg/dL — ABNORMAL HIGH (ref 70–99)
Potassium: 4.2 mmol/L (ref 3.5–5.1)
SODIUM: 138 mmol/L (ref 135–145)

## 2017-09-23 MED ORDER — OXYCODONE-ACETAMINOPHEN 5-325 MG PO TABS: 1.0000 | ORAL_TABLET | ORAL | 0 refills | Status: DC | PRN

## 2017-09-23 MED ORDER — PREDNISONE 20 MG PO TABS: ORAL_TABLET | ORAL | 0 refills | Status: DC

## 2017-09-23 MED ORDER — ONDANSETRON HCL 4 MG/2ML IJ SOLN
4.0000 mg | Freq: Once | INTRAMUSCULAR | Status: AC
  Administered 2017-09-23: 4 mg via INTRAVENOUS
  Filled 2017-09-23: qty 2

## 2017-09-23 MED ORDER — HYDROMORPHONE HCL 1 MG/ML IJ SOLN
1.0000 mg | Freq: Once | INTRAMUSCULAR | Status: AC
  Administered 2017-09-23: 1 mg via INTRAVENOUS
  Filled 2017-09-23: qty 1

## 2017-09-23 MED ORDER — DEXAMETHASONE SODIUM PHOSPHATE 10 MG/ML IJ SOLN
10.0000 mg | Freq: Once | INTRAMUSCULAR | Status: AC
  Administered 2017-09-23: 10 mg via INTRAVENOUS
  Filled 2017-09-23: qty 1

## 2017-09-23 MED ORDER — SODIUM CHLORIDE 0.9 % IV BOLUS
1000.0000 mL | Freq: Once | INTRAVENOUS | Status: AC
  Administered 2017-09-23: 1000 mL via INTRAVENOUS

## 2017-09-23 MED ORDER — KETOROLAC TROMETHAMINE 30 MG/ML IJ SOLN
30.0000 mg | Freq: Once | INTRAMUSCULAR | Status: AC
  Administered 2017-09-23: 30 mg via INTRAVENOUS
  Filled 2017-09-23: qty 1

## 2017-09-23 NOTE — ED Triage Notes (Signed)
Pt said L hip pain worsened last night, pt is taking celebrex and is being seen by PCP for this. C/o pain and numbness down L leg. Denies injury.

## 2017-09-23 NOTE — ED Provider Notes (Signed)
[place] EMERGENCY DEPARTMENT Provider Note   CSN: [id number] Arrival date & time: 09/23/17  0016     History   Chief Complaint Chief Complaint  Patient presents with  . Hip Pain    HPI Brian Melton is a 52 y.o. male.  Patient presents to the emergency department for evaluation of left hip and knee pain.  He reports that he has been having pain in the posterior aspect of his left hip for about 6 months.  He had originally been on Mobic for this, his primary care doctor switched him to Celebrex in the last couple of weeks.  He feels like the pain worsened with that switch and then today he has been having severe, sharp and stabbing pains just behind the hip.  This worsens with movement.  He reports that it is "intolerable".  He is also feeling pain in the area of the knee.  He denies any injury to these areas.  He has had some numbness of the left foot.  He has not noticed any weakness.  Denies change in bowel or bladder habits.     Past Medical History:  Diagnosis Date  . Diabetes mellitus without complication ([place])   . Sleep apnea     Patient Active Problem List   Diagnosis Date Noted  . Diabetes ([place]) 11/10/2015  . Sleep apnea 11/10/2015  . Guaiac positive stools 11/10/2015    Past Surgical History:  Procedure Laterality Date  . COLONOSCOPY N/A 11/22/2015   Procedure: COLONOSCOPY;  Surgeon: Rogene Houston, MD;  Location: [facility] ENDO SUITE;  Service: Endoscopy;  Laterality: N/A;  12:15  . ear drum surgery     infant.  . TONSILLECTOMY          Home Medications    Prior to Admission medications   Medication Sig Start Date End Date Taking? Authorizing Provider  atorvastatin (LIPITOR) 10 MG tablet Take 5 mg by mouth daily.     [provider]  canagliflozin (INVOKANA) 300 MG TABS tablet Take 300 mg by mouth daily before breakfast.    [provider]  glipiZIDE (GLUCOTROL XL) 5 MG 24 hr tablet Take 5 mg by mouth 2 (two) times  daily.     [provider]  losartan (COZAAR) 50 MG tablet Take 50 mg by mouth daily.    [provider]  meloxicam (MOBIC) 7.5 MG tablet Take 7.5 mg by mouth 2 (two) times daily.    [provider]  metFORMIN (GLUCOPHAGE-XR) 500 MG 24 hr tablet Take 1,000 mg by mouth 2 (two) times daily.    [provider]  Omega-3 Fatty Acids (FISH OIL) 1200 MG CAPS Take 1,200 mg by mouth daily.     [provider]  oxyCODONE-acetaminophen (PERCOCET) 5-325 MG tablet Take 1-2 tablets by mouth every 4 (four) hours as needed. 09/23/17   Orpah Greek, MD  predniSONE (DELTASONE) 20 MG tablet 3 tabs po daily x 3 days, then 2 tabs x 3 days, then 1.5 tabs x 3 days, then 1 tab x 3 days, then 0.5 tabs x 3 days 09/23/17   Orpah Greek, MD    Family History No family history on file.  Social History Social History   Tobacco Use  . Smoking status: Never Smoker  . Smokeless tobacco: Current User    Types: Chew  Substance Use Topics  . Alcohol use: No  . Drug use: No     Allergies   Penicillins  Review of Systems Review of Systems  Musculoskeletal: Positive for arthralgias.  All other systems reviewed and are negative.    Physical Exam Updated Vital Signs BP (!) 155/90 (BP Location: Right Arm)   Pulse 64   Temp 98.3 F (36.8 C) (Oral)   Resp 18   Ht 5\' 10"  (1.778 m)   Wt 106.1 kg   SpO2 100%   BMI 33.58 kg/m   Physical Exam  Constitutional: He appears well-developed and well-nourished.  HENT:  Head: Atraumatic.  Eyes: EOM are normal.  Cardiovascular: Normal rate and regular rhythm.  Pulmonary/Chest: Effort normal and breath sounds normal.  Musculoskeletal:       Left hip: He exhibits decreased range of motion (painful inhibition).       Left knee: He exhibits normal range of motion, no swelling, no effusion and no ecchymosis. Tenderness found. Lateral joint line tenderness noted.       Lumbar back: He exhibits tenderness.        Back:     ED Treatments / Results  Labs (all labs ordered are listed, but only abnormal results are displayed) Labs Reviewed  BASIC METABOLIC PANEL - Abnormal; Notable for the following components:      Result Value   Glucose, Bld 184 (*)    All other components within normal limits  CBC WITH DIFFERENTIAL/PLATELET    EKG None  Radiology Dg Knee Complete 4 Views Left  Result Date: 09/23/2017 CLINICAL DATA:  Left knee pain worse last evening. EXAM: LEFT KNEE - COMPLETE 4+ VIEW COMPARISON:  None. FINDINGS: Slight femorotibial and patellofemoral joint space narrowing with minimal spurring along the upper pole the patella and lateral femorotibial compartment. No intra-articular loose bodies, fracture or joint dislocation. No significant joint effusion. Spurring of the tibial spine is noted medially. IMPRESSION: Mild degenerative joint space narrowing spurring as above. No fracture or joint dislocations. No appreciable joint effusion. Electronically Signed   By: Ashley Royalty M.D.   On: 09/23/2017 02:25   Dg Hip Unilat W Or Wo Pelvis 2-3 Views Left  Result Date: 09/23/2017 CLINICAL DATA:  52 year old male with left hip pain. EXAM: DG HIP (WITH OR WITHOUT PELVIS) 2-3V LEFT COMPARISON:  None. FINDINGS: There is no acute fracture or dislocation. Mild osteopenia. There is mild arthritic changes of the hips. Small well corticated bone fragment lateral to the superior aspect of the left acetabulum, chronic. The soft tissues are grossly unremarkable. IMPRESSION: No acute fracture or dislocation. Mild arthritic changes. Electronically Signed   By: Anner Crete M.D.   On: 09/23/2017 02:25    Procedures Procedures (including critical care time)  Medications Ordered in ED Medications  HYDROmorphone (DILAUDID) injection 1 mg (has no administration in time range)  sodium chloride 0.9 % bolus 1,000 mL (0 mLs Intravenous Stopped 09/23/17 0233)  HYDROmorphone (DILAUDID) injection 1 mg (1 mg Intravenous  Given 09/23/17 0203)  ondansetron (ZOFRAN) injection 4 mg (4 mg Intravenous Given 09/23/17 0203)  dexamethasone (DECADRON) injection 10 mg (10 mg Intravenous Given 09/23/17 0203)  ketorolac (TORADOL) 30 MG/ML injection 30 mg (30 mg Intravenous Given 09/23/17 0230)     Initial Impression / Assessment and Plan / ED Course  I have reviewed the triage vital signs and the nursing notes.  Pertinent labs & imaging results that were available during my care of the patient were reviewed by me and considered in my medical decision making (see chart for details).     Patient presents to the emergency department for evaluation of  left leg pain.  He reports that he has been having pain for approximately 6 months, has seen his primary doctor for this.  He has been on NSAIDs but his pain is worsening.  He acutely started to have severe pain today.  No known injury.  Pain is in the posterior hip, at the SI joint region and radiates down into the upper leg to the knee area.   Examination of the knee is unremarkable.  X-ray negative other than arthritis.  Patient has normal strength in the lower extremity, normal reflexes.  He reports numbness in the foot, but does not have any sign of a cauda equina.  No foot drop, no saddle anesthesia.  X-ray of hip shows arthritis, no significant abnormality.  Patient treated with Toradol, Decadron, Dilaudid here in the ER.  Pain is improved but still present.  Patient counseled that he will need to rest the area, use the steroids and see if this helps with the inflammation.  He will be provided analgesia.  Follow-up with his primary doctor this week if not improving, might need an outpatient MRI.  Based on his exam today, however, he does not need any emergent MRI.  Final Clinical Impressions(s) / ED Diagnoses   Final diagnoses:  Sciatica of left side    ED Discharge Orders         Ordered    predniSONE (DELTASONE) 20 MG tablet     09/23/17 0404    oxyCODONE-acetaminophen  (PERCOCET) 5-325 MG tablet  Every 4 hours PRN     09/23/17 0404           Orpah Greek, MD 09/23/17 [phone]

## 2017-09-23 NOTE — ED Notes (Signed)
Pt to radiology via stretcher.  

## 2017-09-23 NOTE — ED Notes (Signed)
Dr. Pollina at bedside   

## 2017-09-26 DIAGNOSIS — M545 Low back pain: Secondary | ICD-10-CM | POA: Diagnosis not present

## 2017-09-28 DIAGNOSIS — M545 Low back pain: Secondary | ICD-10-CM | POA: Diagnosis not present

## 2017-09-30 DIAGNOSIS — M545 Low back pain: Secondary | ICD-10-CM | POA: Diagnosis not present

## 2017-10-02 ENCOUNTER — Other Ambulatory Visit: Payer: Self-pay | Admitting: Neurosurgery

## 2017-10-02 DIAGNOSIS — M549 Dorsalgia, unspecified: Secondary | ICD-10-CM | POA: Diagnosis not present

## 2017-10-02 DIAGNOSIS — M48062 Spinal stenosis, lumbar region with neurogenic claudication: Secondary | ICD-10-CM | POA: Diagnosis not present

## 2017-10-02 DIAGNOSIS — M546 Pain in thoracic spine: Secondary | ICD-10-CM | POA: Diagnosis not present

## 2017-10-02 DIAGNOSIS — M4726 Other spondylosis with radiculopathy, lumbar region: Secondary | ICD-10-CM | POA: Diagnosis not present

## 2017-10-02 DIAGNOSIS — M4316 Spondylolisthesis, lumbar region: Secondary | ICD-10-CM | POA: Diagnosis not present

## 2017-10-02 DIAGNOSIS — M5136 Other intervertebral disc degeneration, lumbar region: Secondary | ICD-10-CM | POA: Diagnosis not present

## 2017-10-07 NOTE — Pre-Procedure Instructions (Signed)
Brian Melton  10/07/2017    Your procedure is scheduled on Thursday, October 10, 2017 at 11:00 Am.   Report to [facility] Entrance "A" Admitting Office at 9:00 AM   Call this number if you have problems the morning of surgery: [phone]   Questions prior to day of surgery, please call [phone] between 8 & 4 PM.   Remember:  Do not eat or drink after midnight Wednesday, 10/09/17.  Take these medicines the morning of surgery with A SIP OF WATER: Prednisone, Hydrocodone - if needed Do not take your Glipizide Wednesday evening nor Thursday AM. Do not take Metformin and Farxiga the morning of surgery.   Stop NSAIDS (Ibuprofen, Celebrex, Aleve, etc) and Fish Oil as of today.   How to Manage Your Diabetes Before Surgery   Why is it important to control my blood sugar before and after surgery?   Improving blood sugar levels before and after surgery helps healing and can limit problems.  A way of improving blood sugar control is eating a healthy diet by:  - Eating less sugar and carbohydrates  - Increasing activity/exercise  - Talk with your doctor about reaching your blood sugar goals  High blood sugars (greater than 180 mg/dL) can raise your risk of infections and slow down your recovery so you will need to focus on controlling your diabetes during the weeks before surgery.  Make sure that the doctor who takes care of your diabetes knows about your planned surgery including the date and location.  How do I manage my blood sugars before surgery?   Check your blood sugar at least 4 times a day, 2 days before surgery to make sure that they are not too high or low.  Check your blood sugar the morning of your surgery when you wake up and every 2 hours until you get to the Short-Stay unit.  Treat a low blood sugar (less than 70 mg/dL) with 1/2 cup of clear juice (cranberry or apple), 4 glucose tablets, OR glucose gel.  Recheck blood sugar in 15 minutes after  treatment (to make sure it is greater than 70 mg/dL).  If blood sugar is not greater than 70 mg/dL on re-check, call [phone] for further instructions.   Report your blood sugar to the Short-Stay nurse when you get to Short-Stay.  References:  [facility], 2007 "How to Manage your Diabetes Before and After Surgery".    Do not wear jewelry.  Do not wear lotions, powders, cologne or deodorant.  Men may shave face and neck.  Do not bring valuables to the hospital.  [facility] is not responsible for any belongings or valuables.  Contacts, dentures or bridgework may not be worn into surgery.  Leave your suitcase in the car.  After surgery it may be brought to your room.  For patients admitted to the hospital, discharge time will be determined by your treatment team.  [facility] - Preparing for Surgery  Before surgery, you can play an important role.  Because skin is not sterile, your skin needs to be as free of germs as possible.  You can reduce the number of germs on you skin by washing with CHG (chlorahexidine gluconate) soap before surgery.  CHG is an antiseptic cleaner which kills germs and bonds with the skin to continue killing germs even after washing.  Oral Hygiene is also important in reducing the risk of infection.  Remember to brush your teeth with your regular  toothpaste the morning of surgery.  Please DO NOT use if you have an allergy to CHG or antibacterial soaps.  If your skin becomes reddened/irritated stop using the CHG and inform your nurse when you arrive at Short Stay.  Do not shave (including legs and underarms) for at least 48 hours prior to the first CHG shower.  You may shave your face.  Please follow these instructions carefully:   1.  Shower with CHG Soap the night before surgery and the morning of Surgery.  2.  If you choose to wash your hair, wash your hair first as usual with your normal shampoo.  3.  After you shampoo, rinse your  hair and body thoroughly to remove the shampoo. 4.  Use CHG as you would any other liquid soap.  You can apply chg directly to the skin and wash gently with a      scrungie or washcloth.           5.  Apply the CHG Soap to your body ONLY FROM THE NECK DOWN.   Do not use on open wounds or open sores. Avoid contact with your eyes, ears, mouth and genitals (private parts).  Wash genitals (private parts) with your normal soap.  6.  Wash thoroughly, paying special attention to the area where your surgery will be performed.  7.  Thoroughly rinse your body with warm water from the neck down.  8.  DO NOT shower/wash with your normal soap after using and rinsing off the CHG Soap.  9.  Pat yourself dry with a clean towel.            10.  Wear clean pajamas.            11.  Place clean sheets on your bed the night of your first shower and do not sleep with pets.  Day of Surgery  Shower as above. Do not apply any lotions/deodorants the morning of surgery.   Please wear clean clothes to the hospital. Remember to brush your teeth with toothpaste.   Please read over the fact sheets that you were given.

## 2017-10-08 ENCOUNTER — Other Ambulatory Visit: Payer: Self-pay | Admitting: Neurosurgery

## 2017-10-08 ENCOUNTER — Encounter (HOSPITAL_COMMUNITY): Payer: Self-pay

## 2017-10-08 ENCOUNTER — Other Ambulatory Visit: Payer: Self-pay

## 2017-10-08 ENCOUNTER — Encounter (HOSPITAL_COMMUNITY)
Admission: RE | Admit: 2017-10-08 | Discharge: 2017-10-08 | Disposition: A | Discharge status: Home / Self Care | Payer: BLUE CROSS/BLUE SHIELD | Source: Ambulatory Visit | Attending: Neurosurgery | Admitting: Neurosurgery

## 2017-10-08 DIAGNOSIS — Z6832 Body mass index (BMI) 32.0-32.9, adult: Secondary | ICD-10-CM | POA: Insufficient documentation

## 2017-10-08 DIAGNOSIS — E669 Obesity, unspecified: Secondary | ICD-10-CM | POA: Diagnosis not present

## 2017-10-08 DIAGNOSIS — Z79899 Other long term (current) drug therapy: Secondary | ICD-10-CM | POA: Insufficient documentation

## 2017-10-08 DIAGNOSIS — G4733 Obstructive sleep apnea (adult) (pediatric): Secondary | ICD-10-CM | POA: Insufficient documentation

## 2017-10-08 DIAGNOSIS — Z01818 Encounter for other preprocedural examination: Secondary | ICD-10-CM | POA: Insufficient documentation

## 2017-10-08 DIAGNOSIS — M48062 Spinal stenosis, lumbar region with neurogenic claudication: Secondary | ICD-10-CM | POA: Diagnosis not present

## 2017-10-08 DIAGNOSIS — E119 Type 2 diabetes mellitus without complications: Secondary | ICD-10-CM | POA: Insufficient documentation

## 2017-10-08 DIAGNOSIS — Z7984 Long term (current) use of oral hypoglycemic drugs: Secondary | ICD-10-CM | POA: Diagnosis not present

## 2017-10-08 HISTORY — DX: Umbilical hernia without obstruction or gangrene: K42.9

## 2017-10-08 HISTORY — DX: Malignant (primary) neoplasm, unspecified: C80.1

## 2017-10-08 HISTORY — DX: Personal history of urinary calculi: Z87.442

## 2017-10-08 HISTORY — DX: Unspecified osteoarthritis, unspecified site: M19.90

## 2017-10-08 LAB — CBC
HEMATOCRIT: 52.1 % — AB (ref 39.0–52.0)
Hemoglobin: 17.7 g/dL — ABNORMAL HIGH (ref 13.0–17.0)
MCH: 30.4 pg (ref 26.0–34.0)
MCHC: 34 g/dL (ref 30.0–36.0)
MCV: 89.4 fL (ref 78.0–100.0)
PLATELETS: 239 10*3/uL (ref 150–400)
RBC: 5.83 MIL/uL — ABNORMAL HIGH (ref 4.22–5.81)
RDW: 12.5 % (ref 11.5–15.5)
WBC: 14.3 10*3/uL — ABNORMAL HIGH (ref 4.0–10.5)

## 2017-10-08 LAB — BASIC METABOLIC PANEL
Anion gap: 13 (ref 5–15)
BUN: 19 mg/dL (ref 6–20)
CO2: 22 mmol/L (ref 22–32)
CREATININE: 0.6 mg/dL — AB (ref 0.61–1.24)
Calcium: 9.6 mg/dL (ref 8.9–10.3)
Chloride: 102 mmol/L (ref 98–111)
GFR calc Af Amer: >60 mL/min (ref >60)
GFR calc non Af Amer: >60 mL/min (ref >60)
Glucose, Bld: 179 mg/dL — ABNORMAL HIGH (ref 70–99)
POTASSIUM: 4.9 mmol/L (ref 3.5–5.1)
Sodium: 137 mmol/L (ref 135–145)

## 2017-10-08 LAB — ABO/RH: ABO/RH(D): O POS

## 2017-10-08 LAB — GLUCOSE, CAPILLARY: Glucose-Capillary: 164 mg/dL — ABNORMAL HIGH (ref 70–99)

## 2017-10-08 LAB — SURGICAL PCR SCREEN
MRSA, PCR: NEGATIVE
Staphylococcus aureus: NEGATIVE

## 2017-10-08 NOTE — Progress Notes (Signed)
Pt denies cardiac history, chest pain or sob. Pt is a type 2 diabetic. Last A1C was 7.9 on 09/02/17, states his Glipizide was increased to twice a day at that time. Pt states he does not check his blood sugar. He states he does have a meter but needs new strips. I encouraged him to get new strips so that he can check his blood sugar the day of surgery. He voiced understanding.

## 2017-10-09 ENCOUNTER — Other Ambulatory Visit: Payer: Self-pay | Admitting: Neurosurgery

## 2017-10-09 NOTE — Progress Notes (Addendum)
Anesthesia Chart Review:  Case:  [id number] Date/Time:  10/10/17 1045   Procedure:  LUMBAR 4- LUMBAR 5 DECOMPRESSION,POSTERIOR LUMBAR INTERBODY FUSION, POSTERIOR LATERAL ARTHRODESIS (N/A Back) - LUMBAR 4- LUMBAR 5 DECOMPRESSION,POSTERIOR LUMBAR INTERBODY FUSION, POSTERIOR LATERAL ARTHRODESIS   Anesthesia type:  General   Pre-op diagnosis:  LUMBAR STENOSIS WITH NEUROGENIC CLAUDICATION   Location:  [facility] OR ROOM 18 / [place] OR   Surgeon:  Jovita Gamma, MD      DISCUSSION: Patient is a 52 year old male scheduled for the above procedure. History includes never smoker, DM2, OSA (CPAP). BMI is consistent with obesity.   Preoperative EKG showed left BBB or undetermined duration. No reported prior cardiac testing. No comparison EKGs seen in [facility] or [facility]--although notes from 2002 indicate EKG then showed NSR (no mention of left BBB). His PCP is through [facility] It does not appear that and EKG has been done there since at least 2015, and their Medical Records department would not tell me over the phone if any EKG was done prior to that. I did fax a request, but have not received records as of yet.   If we cannot determine if left BBB is old then he will need a preoperative cardiology evaluation. Discussed with anesthesiologist Oleta Mouse, MD. Lexine Baton at Dr. Donnella Bi office notified. I also notified her of elevated WBC from 10/08/17.   ADDENDUM 10/16/17 4:00 PM: Patient was seen by cardiologist Candee Furbish, MD on 10/11/17 for preoperative evaluation with newly diagnosed left BBB. A stress and echo were ordered (see below). Based on results, Dr. Marlou Porch wrote, "Moderately reduced ejection fraction of 42% with no perfusion abnormalities. Left bundle branch block present. Echocardiogram confirms EF between 40 and 45%. He may proceed with surgery with moderate overall CV risk. Start Toprol XL 25mg  PO QD. Let's have him come back into clinic in 3 months to see Mickel Baas."  Surgery has now been moved to  10/24/17. I will ask our scheduler to see if patient can come in for lab only visit prior to surgery to update lab work. (UPDATE 10/22/17 3:43 PM: Patient's updated PAT labs noted and appear acceptable for OR. See LABS section. WBC 9.8. Patient instructed to take Toprol on the morning of surgery.)   VS: BP 120/84   Pulse 86   Temp 36.7 C   Resp 18   Ht 5\' 10"  (1.778 m)   Wt 102.6 kg   SpO2 95%   BMI 32.47 kg/m   PROVIDERS: Dione Housekeeper, MD is  ([place]; see Care Everywhere).   LABS: Labs from 10/22/17 include: Lab Results  Component Value Date   WBC 9.8 10/22/2017   HGB 16.0 10/22/2017   HCT 47.4 10/22/2017   PLT 215 10/22/2017   GLUCOSE 152 (H) 10/22/2017   NA 138 10/22/2017   K 4.5 10/22/2017   CL 105 10/22/2017   CREATININE 0.68 10/22/2017   BUN 12 10/22/2017   CO2 23 10/22/2017  A1c 7.9 on 09/02/17 ([place]).   EKG: 10/08/17: NSR, left BBB. Currently, no available comparison EKG tracing. Notes from 10/23/00 during admission for probable pleurisy mentioned EKG showed NSR (no mention of left BBB) and saw cardiologist Dr. Terance Ice, now retired.    CV:  Nuclear stress test 10/15/17:  The left ventricular ejection fraction is moderately decreased (30-44%).  Nuclear stress EF: 42%.  There was no ST segment deviation noted during stress.  No T wave inversion was noted during stress.  Defect 1: There is a medium defect of moderate severity present in the basal inferoseptal, mid inferoseptal and apical septal location.  This is an intermediate risk study. Intermediate risk study due to reduced left ventricular systolic function, but without ischemic perfusion abnormalities. Fixed moderate inferoseptal defect is consistent with LBBB-related artifact. Consider nonischemic cardiomyopathy.  Echo 10/14/17: Study Conclusions - Left ventricle: Diffuse hypokinesis with abnormal septal motion   form LBBB. The  cavity size was moderately dilated. Wall thickness   was increased in a pattern of mild LVH. Systolic function was   mildly to moderately reduced. The estimated ejection fraction was   in the range of 40% to 45%. Doppler parameters are consistent   with abnormal left ventricular relaxation (grade 1 diastolic   dysfunction).   Past Medical History:  Diagnosis Date  . Arthritis   . Cancer ([place])    skin cancer  . Diabetes mellitus without complication ([place])   . History of kidney stones   . Sleep apnea    uses cpap  . Umbilical hernia     Past Surgical History:  Procedure Laterality Date  . COLONOSCOPY N/A 11/22/2015   Procedure: COLONOSCOPY;  Surgeon: Rogene Houston, MD;  [facility];  Service: Endoscopy;  Laterality: N/A;  12:15  . ear drum surgery     infant.  . TONSILLECTOMY      MEDICATIONS: . atorvastatin (LIPITOR) 10 MG tablet  . celecoxib (CELEBREX) 200 MG capsule  . cyclobenzaprine (FLEXERIL) 10 MG tablet  . dapagliflozin propanediol (FARXIGA) 10 MG TABS tablet  . glipiZIDE (GLUCOTROL XL) 5 MG 24 hr tablet  . HYDROcodone-acetaminophen (NORCO/VICODIN) 5-325 MG tablet  . ibuprofen (ADVIL,MOTRIN) 200 MG tablet  . losartan (COZAAR) 100 MG tablet  . metFORMIN (GLUCOPHAGE-XR) 500 MG 24 hr tablet  . methocarbamol (ROBAXIN) 500 MG tablet  . Omega-3 Fatty Acids (FISH OIL) 1200 MG CAPS  . oxyCODONE-acetaminophen (PERCOCET) 5-325 MG tablet  . predniSONE (DELTASONE) 20 MG tablet   No current facility-administered medications for this encounter.     [facility] Short Stay Center/Anesthesiology Phone [phone] 10/09/2017 11:00 AM

## 2017-10-10 ENCOUNTER — Inpatient Hospital Stay (HOSPITAL_COMMUNITY): Admission: RE | Admit: 2017-10-10 | Payer: BLUE CROSS/BLUE SHIELD | Source: Ambulatory Visit | Admitting: Neurosurgery

## 2017-10-10 ENCOUNTER — Encounter (HOSPITAL_COMMUNITY): Admission: RE | Payer: Self-pay | Source: Ambulatory Visit

## 2017-10-10 SURGERY — POSTERIOR LUMBAR FUSION 1 LEVEL
Anesthesia: General | Site: Back

## 2017-10-10 NOTE — Progress Notes (Signed)
Cardiology Office Note:    Date:  10/11/2017   ID:  Brian Melton, DOB 1965-12-21, MRN [id number]  PCP:  Dione Housekeeper, MD  Cardiologist:  No primary care provider on file.  Electrophysiologist:  None   Referring MD: Dione Housekeeper, MD     History of Present Illness:    Brian Melton is a 52 y.o. male here for preoperative risk stratification prior to surgery at the request of Dr. Edrick Oh and Sherwood Gambler.  Has diabetes with hyperlipidemia, hypertension.  EKG performed on 10/08/2017 demonstrated a sinus rhythm with left bundle branch block.  Newly discovered.  He denies any syncopal episodes, no significant chest pain, no significant shortness of breath no fevers chills nausea vomiting, bleeding.  He goes to his primary doctor every 3 months for adjustments for his diabetes.  He is on angiotensin receptor blocker for renal protection.  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   Past Medical History:  Diagnosis Date  . Arthritis   . Cancer ([place])    skin cancer  . Diabetes mellitus without complication ([place])   . History of kidney stones   . Sleep apnea    uses cpap  . Umbilical hernia     Past Surgical History:  Procedure Laterality Date  . COLONOSCOPY N/A 11/22/2015   Procedure: COLONOSCOPY;  Surgeon: Rogene Houston, MD;  [facility];  Service: Endoscopy;  Laterality: N/A;  12:15  . ear drum surgery     infant.  . TONSILLECTOMY      Current Medications: Current Meds  Medication Sig  . atorvastatin (LIPITOR) 10 MG tablet Take 10 mg by mouth at bedtime.   . celecoxib (CELEBREX) 200 MG capsule Take 200 mg by mouth 2 (two) times daily.  . cyclobenzaprine (FLEXERIL) 10 MG tablet Take 10 mg by mouth 3 (three) times daily as needed for muscle spasms.  . dapagliflozin propanediol (FARXIGA) 10 MG TABS tablet Take 10 mg by mouth daily.  Marland Kitchen glipiZIDE (GLUCOTROL XL) 5 MG 24 hr tablet Take 5-10 mg by  mouth See admin instructions. Take 10 mg in the morning and 5 mg at bedtime  . HYDROcodone-acetaminophen (NORCO/VICODIN) 5-325 MG tablet Take 1 tablet by mouth every 4 (four) hours as needed for moderate pain.  Marland Kitchen ibuprofen (ADVIL,MOTRIN) 200 MG tablet Take 400 mg by mouth 2 (two) times daily as needed for moderate pain.  Marland Kitchen losartan (COZAAR) 100 MG tablet Take 100 mg by mouth daily.  . metFORMIN (GLUCOPHAGE-XR) 500 MG 24 hr tablet Take 1,000 mg by mouth 2 (two) times daily.  . Omega-3 Fatty Acids (FISH OIL) 1200 MG CAPS Take 1,200 mg by mouth 2 (two) times daily.   Marland Kitchen oxyCODONE-acetaminophen (PERCOCET) 5-325 MG tablet Take 1-2 tablets by mouth every 4 (four) hours as needed.     Allergies:   Penicillins   Social History   Socioeconomic History  . Marital status: Married    Spouse name: Not on file  . Number of children: Not on file  . Years of education: Not on file  . Highest education level: Not on file  Occupational History  . Not on file  Social Needs  . Financial resource strain: Not on file  . Food insecurity:    Worry: Not on file    Inability: Not on file  . Transportation needs:    Medical: Not on file    Non-medical: Not on file  Tobacco  Use  . Smoking status: Never Smoker  . Smokeless tobacco: Current User    Types: Chew  Substance and Sexual Activity  . Alcohol use: No  . Drug use: No  . Sexual activity: Not on file  Lifestyle  . Physical activity:    Days per week: Not on file    Minutes per session: Not on file  . Stress: Not on file  Relationships  . Social connections:    Talks on phone: Not on file    Gets together: Not on file    Attends religious service: Not on file    Active member of club or organization: Not on file    Attends meetings of clubs or organizations: Not on file    Relationship status: Not on file  Other Topics Concern  . Not on file  Social History Narrative  . Not on file     Family History: The patient's family history  includes Bell's palsy in his mother; Diabetes in his mother.  ROS:   Please see the history of present illness.     All other systems reviewed and are negative.  EKGs/Labs/Other Studies Reviewed:    The following studies were reviewed today: Prior office notes, lab work, EKG reviewed  EKG: Left bundle branch block personally reviewed and interpreted.  Recent Labs: 10/08/2017: BUN 19; Creatinine, Ser 0.60; Hemoglobin 17.7; Platelets 239; Potassium 4.9; Sodium 137  Recent Lipid Panel No results found for: CHOL, TRIG, HDL, CHOLHDL, VLDL, LDLCALC, LDLDIRECT  Physical Exam:    VS:  BP 130/80   Pulse 71   Ht 5\' 10"  (1.778 m)   Wt 230 lb 6.4 oz (104.5 kg)   SpO2 96%   BMI 33.06 kg/m     Wt Readings from Last 3 Encounters:  10/11/17 230 lb 6.4 oz (104.5 kg)  10/08/17 226 lb 4.8 oz (102.6 kg)  09/23/17 234 lb (106.1 kg)     GEN:  Well nourished, well developed in no acute distress obese HEENT: Normal NECK: No JVD; No carotid bruits LYMPHATICS: No lymphadenopathy CARDIAC: RRR, no murmurs, rubs, gallops RESPIRATORY:  Clear to auscultation without rales, wheezing or rhonchi  ABDOMEN: Soft, non-tender, non-distended MUSCULOSKELETAL:  No edema; No deformity  SKIN: Warm and dry NEUROLOGIC:  Alert and oriented x 3 PSYCHIATRIC:  Normal affect   ASSESSMENT:    1. Pre-operative cardiovascular examination   2. Abnormal electrocardiogram   3. LBBB (left bundle branch block)   4. Diabetes mellitus with coincident hypertension ([place])   5. Pure hypercholesterolemia   6. Obesity (BMI 30.0-34.9)   7. Tobacco use    PLAN:    In order of problems listed above:  Preoperative cardiac risk stratification prior to lumbar spine surgery, left bundle branch block -Patient has cardiac risk factors of diabetes, age, hyperlipidemia.  A1c is 7.9. - He also has a newly discovered left bundle branch block which can be associated with underlying coronary artery disease. -Given these findings, we  will go ahead and proceed with Lexiscan nuclear stress test given his underlying back pain, currently in wheelchair in room, and I would also check an echocardiogram to ensure that there is no evidence of cardiomyopathy.  Diabetes with hyperlipidemia/hypertension - Primary team continuing to utilize medications as above.  Reviewed.  Excellent use of atorvastatin 10 mg this patient with diabetes.  Hip pain/back pain - Planned lumbar spine surgery.  Potentially next Wednesday, Dr. Sherwood Gambler  Tobacco use/chewing/DIP -Encouraged cessation.  Never smoked.  Obesity -Encourage weight loss.  BMI 33.  We will follow-up with results of study.  Medication Adjustments/Labs and Tests Ordered: Current medicines are reviewed at length with the patient today.  Concerns regarding medicines are outlined above.  Orders Placed This Encounter  Procedures  . MYOCARDIAL PERFUSION IMAGING  . ECHOCARDIOGRAM COMPLETE   No orders of the defined types were placed in this encounter.   Patient Instructions  Medication Instructions:  The current medical regimen is effective;  continue present plan and medications.  Testing/Procedures: Your physician has requested that you have an echocardiogram. Echocardiography is a painless test that uses sound waves to create images of your heart. It provides your doctor with information about the size and shape of your heart and how well your heart's chambers and valves are working. This procedure takes approximately one hour. There are no restrictions for this procedure.  Your physician has requested that you have a lexiscan myoview. For further information please visit [email or web address]. Please follow instruction sheet, as given.  Follow-Up: Follow up as needed after the above testing.  Thank you for choosing [facility]!!         Signed, Candee Furbish, MD  10/11/2017 8:29 AM    [facility]

## 2017-10-11 ENCOUNTER — Telehealth (HOSPITAL_COMMUNITY): Payer: Self-pay

## 2017-10-11 ENCOUNTER — Encounter: Payer: Self-pay | Admitting: *Deleted

## 2017-10-11 ENCOUNTER — Ambulatory Visit: Payer: BLUE CROSS/BLUE SHIELD | Admitting: Cardiology

## 2017-10-11 ENCOUNTER — Encounter: Payer: Self-pay | Admitting: Cardiology

## 2017-10-11 VITALS — BP 130/80 | HR 71 | Ht 70.0 in | Wt 230.4 lb

## 2017-10-11 DIAGNOSIS — Z01810 Encounter for preprocedural cardiovascular examination: Secondary | ICD-10-CM

## 2017-10-11 DIAGNOSIS — E119 Type 2 diabetes mellitus without complications: Secondary | ICD-10-CM

## 2017-10-11 DIAGNOSIS — Z720 Tobacco use: Secondary | ICD-10-CM

## 2017-10-11 DIAGNOSIS — I447 Left bundle-branch block, unspecified: Secondary | ICD-10-CM | POA: Diagnosis not present

## 2017-10-11 DIAGNOSIS — E669 Obesity, unspecified: Secondary | ICD-10-CM

## 2017-10-11 DIAGNOSIS — E7800 Pure hypercholesterolemia, unspecified: Secondary | ICD-10-CM

## 2017-10-11 DIAGNOSIS — R9431 Abnormal electrocardiogram [ECG] [EKG]: Secondary | ICD-10-CM

## 2017-10-11 DIAGNOSIS — I10 Essential (primary) hypertension: Secondary | ICD-10-CM

## 2017-10-11 NOTE — Telephone Encounter (Signed)
Encounter complete. 

## 2017-10-11 NOTE — Patient Instructions (Signed)
Medication Instructions:  The current medical regimen is effective;  continue present plan and medications.  Testing/Procedures: Your physician has requested that you have an echocardiogram. Echocardiography is a painless test that uses sound waves to create images of your heart. It provides your doctor with information about the size and shape of your heart and how well your heart's chambers and valves are working. This procedure takes approximately one hour. There are no restrictions for this procedure.  Your physician has requested that you have a lexiscan myoview. For further information please visit [email or web address]. Please follow instruction sheet, as given.  Follow-Up: Follow up as needed after the above testing.  Thank you for choosing  [facility]!!     

## 2017-10-14 ENCOUNTER — Ambulatory Visit (HOSPITAL_COMMUNITY): Payer: BLUE CROSS/BLUE SHIELD | Attending: Cardiovascular Disease

## 2017-10-14 ENCOUNTER — Other Ambulatory Visit: Payer: Self-pay

## 2017-10-14 DIAGNOSIS — I447 Left bundle-branch block, unspecified: Secondary | ICD-10-CM | POA: Diagnosis not present

## 2017-10-14 DIAGNOSIS — I088 Other rheumatic multiple valve diseases: Secondary | ICD-10-CM | POA: Diagnosis not present

## 2017-10-14 DIAGNOSIS — E119 Type 2 diabetes mellitus without complications: Secondary | ICD-10-CM | POA: Insufficient documentation

## 2017-10-14 DIAGNOSIS — R9431 Abnormal electrocardiogram [ECG] [EKG]: Secondary | ICD-10-CM | POA: Diagnosis not present

## 2017-10-14 DIAGNOSIS — G4730 Sleep apnea, unspecified: Secondary | ICD-10-CM | POA: Diagnosis not present

## 2017-10-14 DIAGNOSIS — I10 Essential (primary) hypertension: Secondary | ICD-10-CM | POA: Insufficient documentation

## 2017-10-14 DIAGNOSIS — E785 Hyperlipidemia, unspecified: Secondary | ICD-10-CM | POA: Insufficient documentation

## 2017-10-14 DIAGNOSIS — Z01810 Encounter for preprocedural cardiovascular examination: Secondary | ICD-10-CM

## 2017-10-15 ENCOUNTER — Ambulatory Visit (HOSPITAL_COMMUNITY)
Admission: RE | Admit: 2017-10-15 | Discharge: 2017-10-15 | Disposition: A | Discharge status: Home / Self Care | Payer: BLUE CROSS/BLUE SHIELD | Source: Ambulatory Visit | Attending: Internal Medicine | Admitting: Internal Medicine

## 2017-10-15 DIAGNOSIS — Z01810 Encounter for preprocedural cardiovascular examination: Secondary | ICD-10-CM | POA: Insufficient documentation

## 2017-10-15 DIAGNOSIS — R9431 Abnormal electrocardiogram [ECG] [EKG]: Secondary | ICD-10-CM | POA: Diagnosis not present

## 2017-10-15 LAB — MYOCARDIAL PERFUSION IMAGING
CHL CUP NUCLEAR SDS: 0
CHL CUP RESTING HR STRESS: 73 {beats}/min
LV sys vol: 96 mL
LVDIAVOL: 167 mL (ref 62–150)
Peak HR: 96 {beats}/min
SRS: 15
SSS: 15
TID: 1.04

## 2017-10-15 MED ORDER — TECHNETIUM TC 99M TETROFOSMIN IV KIT
10.8000 | PACK | Freq: Once | INTRAVENOUS | Status: AC | PRN
  Administered 2017-10-15: 10.8 via INTRAVENOUS
  Filled 2017-10-15: qty 11

## 2017-10-15 MED ORDER — TECHNETIUM TC 99M TETROFOSMIN IV KIT
31.6000 | PACK | Freq: Once | INTRAVENOUS | Status: AC | PRN
  Administered 2017-10-15: 31.6 via INTRAVENOUS
  Filled 2017-10-15: qty 32

## 2017-10-15 MED ORDER — REGADENOSON 0.4 MG/5ML IV SOLN
0.4000 mg | Freq: Once | INTRAVENOUS | Status: AC
  Administered 2017-10-15: 0.4 mg via INTRAVENOUS

## 2017-10-16 ENCOUNTER — Telehealth: Payer: Self-pay | Admitting: Cardiology

## 2017-10-16 LAB — TYPE AND SCREEN
ABO/RH(D): O POS
Antibody Screen: NEGATIVE

## 2017-10-16 MED ORDER — METOPROLOL SUCCINATE ER 25 MG PO TB24: 25.0000 mg | ORAL_TABLET | Freq: Every day | ORAL | 3 refills | Status: DC

## 2017-10-16 NOTE — Telephone Encounter (Signed)
See results note ./cy 

## 2017-10-16 NOTE — Telephone Encounter (Signed)
-----   Message from Jerline Pain, MD sent at 10/16/2017  3:32 PM EDT ----- Moderately reduced ejection fraction of 42% with no perfusion abnormalities. Left bundle branch block present. Echocardiogram confirms EF between 40 and 45%. He may proceed with surgery with moderate overall CV risk. Start Toprol XL 25mg  PO QD. Let's have him come back into clinic in 3 months to see Mickel Baas.  Candee Furbish, MD

## 2017-10-16 NOTE — Telephone Encounter (Signed)
Pt aware of test results; myocardial profusion and echo results. Pt understands he is OK to proceed with his surgery per Dr Marlou Porch. Pt agrees to start Toprol XL 25mg  qd. Medication sent in to pt's pharmacy.   A copy of results and recommendations have also been forwarded to Dr Sherwood Gambler and pts PCP.

## 2017-10-16 NOTE — Telephone Encounter (Signed)
Pt notified that echo has not been reviewed by Dr Kingsley Plan at this time will cal with results once reviewed ./cy

## 2017-10-16 NOTE — Telephone Encounter (Signed)
New Message:    Patient is calling for the ECHO results and if he is clear to have surgery

## 2017-10-22 ENCOUNTER — Encounter (HOSPITAL_COMMUNITY)
Admission: RE | Admit: 2017-10-22 | Discharge: 2017-10-22 | Disposition: A | Discharge status: Home / Self Care | Payer: BLUE CROSS/BLUE SHIELD | Source: Ambulatory Visit | Attending: Neurosurgery | Admitting: Neurosurgery

## 2017-10-22 DIAGNOSIS — Z01812 Encounter for preprocedural laboratory examination: Secondary | ICD-10-CM | POA: Diagnosis not present

## 2017-10-22 LAB — BASIC METABOLIC PANEL
Anion gap: 10 (ref 5–15)
BUN: 12 mg/dL (ref 6–20)
CALCIUM: 9.3 mg/dL (ref 8.9–10.3)
CO2: 23 mmol/L (ref 22–32)
CREATININE: 0.68 mg/dL (ref 0.61–1.24)
Chloride: 105 mmol/L (ref 98–111)
GFR calc Af Amer: >60 mL/min (ref >60)
Glucose, Bld: 152 mg/dL — ABNORMAL HIGH (ref 70–99)
Potassium: 4.5 mmol/L (ref 3.5–5.1)
SODIUM: 138 mmol/L (ref 135–145)

## 2017-10-22 LAB — CBC
HCT: 47.4 % (ref 39.0–52.0)
Hemoglobin: 16 g/dL (ref 13.0–17.0)
MCH: 30 pg (ref 26.0–34.0)
MCHC: 33.8 g/dL (ref 30.0–36.0)
MCV: 88.9 fL (ref 78.0–100.0)
PLATELETS: 215 10*3/uL (ref 150–400)
RBC: 5.33 MIL/uL (ref 4.22–5.81)
RDW: 12.6 % (ref 11.5–15.5)
WBC: 9.8 10*3/uL (ref 4.0–10.5)

## 2017-10-22 LAB — TYPE AND SCREEN
ABO/RH(D): O POS
ANTIBODY SCREEN: NEGATIVE

## 2017-10-22 LAB — GLUCOSE, CAPILLARY: Glucose-Capillary: 121 mg/dL — ABNORMAL HIGH (ref 70–99)

## 2017-10-22 NOTE — Pre-Procedure Instructions (Signed)
Brian Melton  10/22/2017    Your procedure is scheduled on Thursday, October 3.   Report to [facility] Xxx Entrance "A" Admitting Office at 6:30AM              Your surgery or procedure is scheduled for 8:30AM   Call this number if you have problems the morning of surgery: [phone]   Questions prior to day of surgery, please call [phone] between 8 & 4 PM.   Remember:  Do not eat or drink after midnight Wednesday, 10/2.  Take these medicines the morning of surgery with A SIP OF WATER:  Metoprolol (ToprolXL)   Take if needed: Hydrocodone- Acetaminophen or Oxycodone- Acetaminophen >>>>Do not take your Glipizide Wednesday evening nor Thursday AM. Do not take Metformin and Farxiga the morning of surgery. <<<<  Stop NSAIDS (Ibuprofen, Celebrex, Aleve, etc) and Fish Oil as of today.   How to Manage Your Diabetes Before Surgery   Why is it important to control my blood sugar before and after surgery?   Improving blood sugar levels before and after surgery helps healing and can limit problems.  A way of improving blood sugar control is eating a healthy diet by:  - Eating less sugar and carbohydrates  - Increasing activity/exercise  - Talk with your doctor about reaching your blood sugar goals  High blood sugars (greater than 180 mg/dL) can raise your risk of infections and slow down your recovery so you will need to focus on controlling your diabetes during the weeks before surgery.  Make sure that the doctor who takes care of your diabetes knows about your planned surgery including the date and location.  How do I manage my blood sugars before surgery?   Check your blood sugar at least 4 times a day, 2 days before surgery to make sure that they are not too high or low.  Check your blood sugar the morning of your surgery when you wake up and every 2 hours until you get to the Short-Stay unit.  Treat a low blood sugar (less than 70 mg/dL) with 1/2 cup of  clear juice (cranberry or apple), 4 glucose tablets, OR glucose gel.  Recheck blood sugar in 15 minutes after treatment (to make sure it is greater than 70 mg/dL).  If blood sugar is not greater than 70 mg/dL on re-check, call [phone] for further instructions.   Report your blood sugar to the Short-Stay nurse when you get to Short-Stay.  References:  [facility], 2007 "How to Manage your Diabetes Before and After Surgery".    Do not wear jewelry.  Do not wear lotions, powders, cologne or deodorant.  Men may shave face and neck.  Do not bring valuables to the hospital.  [facility] is not responsible for any belongings or valuables.  Contacts, dentures or bridgework may not be worn into surgery.  Leave your suitcase in the car.  After surgery it may be brought to your room.  For patients admitted to the hospital, discharge time will be determined by your treatment team.  [facility] - Preparing for Surgery  Before surgery, you can play an important role.  Because skin is not sterile, your skin needs to be as free of germs as possible.  You can reduce the number of germs on you skin by washing with CHG (chlorahexidine gluconate) soap before surgery.  CHG is an antiseptic cleaner which kills germs and bonds with the skin to continue killing germs  even after washing.  Oral Hygiene is also important in reducing the risk of infection.  Remember to brush your teeth with your regular toothpaste the morning of surgery.  Please DO NOT use if you have an allergy to CHG or antibacterial soaps.  If your skin becomes reddened/irritated stop using the CHG and inform your nurse when you arrive at Short Stay.  Do not shave (including legs and underarms) for at least 48 hours prior to the first CHG shower.  You may shave your face.  Please follow these instructions carefully:   1.  Shower with CHG Soap the night before surgery and the morning of Surgery.  2.  If you choose  to wash your hair, wash your hair first as usual with your normal shampoo.  3.  After you shampoo, rinse your hair and body thoroughly to remove the shampoo. 4.  Use CHG as you would any other liquid soap.  You can apply chg directly to the skin and wash gently with a      scrungie or washcloth.           5.  Apply the CHG Soap to your body ONLY FROM THE NECK DOWN.   Do not use on open wounds or open sores. Avoid contact with your eyes, ears, mouth and genitals (private parts).  Wash genitals (private parts) with your normal soap.  6.  Wash thoroughly, paying special attention to the area where your surgery will be performed.  7.  Thoroughly rinse your body with warm water from the neck down.  8.  DO NOT shower/wash with your normal soap after using and rinsing off the CHG Soap.  9.  Pat yourself dry with a clean towel.            10.  Wear clean pajamas.            11.  Place clean sheets on your bed the night of your first shower and do not sleep with pets.  Day of Surgery  Shower as above. Do not apply any lotions/deodorants the morning of surgery.   Please wear clean clothes to the hospital. Remember to brush your teeth with toothpaste.   Please read over the fact sheets that you were given.

## 2017-10-24 ENCOUNTER — Encounter (HOSPITAL_COMMUNITY): Payer: Self-pay | Admitting: Anesthesiology

## 2017-10-24 ENCOUNTER — Inpatient Hospital Stay (HOSPITAL_COMMUNITY): Payer: BLUE CROSS/BLUE SHIELD | Admitting: Vascular Surgery

## 2017-10-24 ENCOUNTER — Encounter (HOSPITAL_COMMUNITY)
Admission: RE | Disposition: A | Discharge status: Home / Self Care | Payer: Self-pay | Source: Home / Self Care | Attending: Neurosurgery

## 2017-10-24 ENCOUNTER — Inpatient Hospital Stay (HOSPITAL_COMMUNITY): Payer: BLUE CROSS/BLUE SHIELD | Admitting: Anesthesiology

## 2017-10-24 ENCOUNTER — Inpatient Hospital Stay (HOSPITAL_COMMUNITY): Payer: BLUE CROSS/BLUE SHIELD

## 2017-10-24 ENCOUNTER — Inpatient Hospital Stay (HOSPITAL_COMMUNITY)
Admission: RE | Admit: 2017-10-24 | Discharge: 2017-10-25 | DRG: 455 | Disposition: A | Discharge status: Home / Self Care | Payer: BLUE CROSS/BLUE SHIELD | Attending: Neurosurgery | Admitting: Neurosurgery

## 2017-10-24 DIAGNOSIS — Z85828 Personal history of other malignant neoplasm of skin: Secondary | ICD-10-CM | POA: Diagnosis not present

## 2017-10-24 DIAGNOSIS — Z7984 Long term (current) use of oral hypoglycemic drugs: Secondary | ICD-10-CM

## 2017-10-24 DIAGNOSIS — M48062 Spinal stenosis, lumbar region with neurogenic claudication: Secondary | ICD-10-CM | POA: Diagnosis present

## 2017-10-24 DIAGNOSIS — Z880 Allergy status to penicillin: Secondary | ICD-10-CM | POA: Diagnosis not present

## 2017-10-24 DIAGNOSIS — M5126 Other intervertebral disc displacement, lumbar region: Secondary | ICD-10-CM | POA: Diagnosis not present

## 2017-10-24 DIAGNOSIS — Z833 Family history of diabetes mellitus: Secondary | ICD-10-CM

## 2017-10-24 DIAGNOSIS — Z720 Tobacco use: Secondary | ICD-10-CM | POA: Diagnosis not present

## 2017-10-24 DIAGNOSIS — Z419 Encounter for procedure for purposes other than remedying health state, unspecified: Secondary | ICD-10-CM

## 2017-10-24 DIAGNOSIS — Z79899 Other long term (current) drug therapy: Secondary | ICD-10-CM

## 2017-10-24 DIAGNOSIS — Z87442 Personal history of urinary calculi: Secondary | ICD-10-CM | POA: Diagnosis not present

## 2017-10-24 DIAGNOSIS — M5136 Other intervertebral disc degeneration, lumbar region: Secondary | ICD-10-CM | POA: Diagnosis not present

## 2017-10-24 DIAGNOSIS — E119 Type 2 diabetes mellitus without complications: Secondary | ICD-10-CM | POA: Diagnosis present

## 2017-10-24 DIAGNOSIS — M47816 Spondylosis without myelopathy or radiculopathy, lumbar region: Secondary | ICD-10-CM | POA: Diagnosis present

## 2017-10-24 DIAGNOSIS — M4316 Spondylolisthesis, lumbar region: Secondary | ICD-10-CM | POA: Diagnosis present

## 2017-10-24 DIAGNOSIS — Z79891 Long term (current) use of opiate analgesic: Secondary | ICD-10-CM

## 2017-10-24 DIAGNOSIS — G4730 Sleep apnea, unspecified: Secondary | ICD-10-CM | POA: Diagnosis not present

## 2017-10-24 DIAGNOSIS — Z791 Long term (current) use of non-steroidal anti-inflammatories (NSAID): Secondary | ICD-10-CM | POA: Diagnosis not present

## 2017-10-24 DIAGNOSIS — M545 Low back pain: Secondary | ICD-10-CM | POA: Diagnosis not present

## 2017-10-24 DIAGNOSIS — M4326 Fusion of spine, lumbar region: Secondary | ICD-10-CM | POA: Diagnosis not present

## 2017-10-24 LAB — GLUCOSE, CAPILLARY
Glucose-Capillary: 170 mg/dL — ABNORMAL HIGH (ref 70–99)
Glucose-Capillary: 222 mg/dL — ABNORMAL HIGH (ref 70–99)
Glucose-Capillary: 257 mg/dL — ABNORMAL HIGH (ref 70–99)
Glucose-Capillary: 223 mg/dL — ABNORMAL HIGH (ref 70–99)
Glucose-Capillary: 178 mg/dL — ABNORMAL HIGH (ref 70–99)

## 2017-10-24 SURGERY — POSTERIOR LUMBAR FUSION 1 LEVEL
Anesthesia: General | Site: Back

## 2017-10-24 MED ORDER — GLIPIZIDE ER 5 MG PO TB24
5.0000 mg | ORAL_TABLET | Freq: Every day | ORAL | Status: DC
  Administered 2017-10-24: 5 mg via ORAL
  Filled 2017-10-24: qty 1

## 2017-10-24 MED ORDER — FENTANYL CITRATE (PF) 250 MCG/5ML IJ SOLN
INTRAMUSCULAR | Status: AC
  Filled 2017-10-24: qty 5

## 2017-10-24 MED ORDER — ACETAMINOPHEN 650 MG RE SUPP: 650.0000 mg | RECTAL | Status: DC | PRN

## 2017-10-24 MED ORDER — ACETAMINOPHEN 10 MG/ML IV SOLN
INTRAVENOUS | Status: DC | PRN
  Administered 2017-10-24: 1000 mg via INTRAVENOUS

## 2017-10-24 MED ORDER — SODIUM CHLORIDE 0.9 % IV SOLN
INTRAVENOUS | Status: DC | PRN
  Administered 2017-10-24: 40 ug/min via INTRAVENOUS

## 2017-10-24 MED ORDER — LOSARTAN POTASSIUM 50 MG PO TABS
100.0000 mg | ORAL_TABLET | Freq: Every day | ORAL | Status: DC
  Administered 2017-10-24 – 2017-10-25 (×2): 100 mg via ORAL
  Filled 2017-10-24 (×2): qty 2

## 2017-10-24 MED ORDER — LIDOCAINE HCL (CARDIAC) PF 100 MG/5ML IV SOSY
PREFILLED_SYRINGE | INTRAVENOUS | Status: DC | PRN
  Administered 2017-10-24: 100 mg via INTRATRACHEAL

## 2017-10-24 MED ORDER — OXYCODONE HCL 5 MG/5ML PO SOLN: 5.0000 mg | Freq: Once | ORAL | Status: DC | PRN

## 2017-10-24 MED ORDER — THROMBIN 20000 UNITS EX SOLR
CUTANEOUS | Status: DC | PRN
  Administered 2017-10-24: 20 mL via TOPICAL

## 2017-10-24 MED ORDER — SUGAMMADEX SODIUM 200 MG/2ML IV SOLN
INTRAVENOUS | Status: DC | PRN
  Administered 2017-10-24: 200 mg via INTRAVENOUS

## 2017-10-24 MED ORDER — CANAGLIFLOZIN 100 MG PO TABS
100.0000 mg | ORAL_TABLET | Freq: Every day | ORAL | Status: DC
  Administered 2017-10-25: 100 mg via ORAL
  Filled 2017-10-24: qty 1

## 2017-10-24 MED ORDER — KETOROLAC TROMETHAMINE 30 MG/ML IJ SOLN
30.0000 mg | Freq: Four times a day (QID) | INTRAMUSCULAR | Status: DC
  Administered 2017-10-24: 30 mg via INTRAVENOUS

## 2017-10-24 MED ORDER — ROCURONIUM BROMIDE 50 MG/5ML IV SOSY
PREFILLED_SYRINGE | INTRAVENOUS | Status: AC
  Filled 2017-10-24: qty 5

## 2017-10-24 MED ORDER — CEFAZOLIN SODIUM-DEXTROSE 2-3 GM-%(50ML) IV SOLR
INTRAVENOUS | Status: DC | PRN
  Administered 2017-10-24 (×2): 2 g via INTRAVENOUS

## 2017-10-24 MED ORDER — METFORMIN HCL ER 500 MG PO TB24
1000.0000 mg | ORAL_TABLET | Freq: Two times a day (BID) | ORAL | Status: DC
  Administered 2017-10-24 – 2017-10-25 (×2): 1000 mg via ORAL
  Filled 2017-10-24 (×2): qty 2

## 2017-10-24 MED ORDER — INSULIN ASPART 100 UNIT/ML ~~LOC~~ SOLN
0.0000 [IU] | Freq: Three times a day (TID) | SUBCUTANEOUS | Status: DC
  Administered 2017-10-24: 5 [IU] via SUBCUTANEOUS
  Administered 2017-10-25: 3 [IU] via SUBCUTANEOUS

## 2017-10-24 MED ORDER — ALUM & MAG HYDROXIDE-SIMETH 200-200-20 MG/5ML PO SUSP: 30.0000 mL | Freq: Four times a day (QID) | ORAL | Status: DC | PRN

## 2017-10-24 MED ORDER — BISACODYL 10 MG RE SUPP: 10.0000 mg | Freq: Every day | RECTAL | Status: DC | PRN

## 2017-10-24 MED ORDER — FENTANYL CITRATE (PF) 100 MCG/2ML IJ SOLN
25.0000 ug | INTRAMUSCULAR | Status: DC | PRN
  Administered 2017-10-24 (×2): 25 ug via INTRAVENOUS
  Administered 2017-10-24: 50 ug via INTRAVENOUS

## 2017-10-24 MED ORDER — CEFAZOLIN SODIUM 1 G IJ SOLR
INTRAMUSCULAR | Status: AC
  Filled 2017-10-24: qty 40

## 2017-10-24 MED ORDER — FENTANYL CITRATE (PF) 100 MCG/2ML IJ SOLN
INTRAMUSCULAR | Status: AC
  Filled 2017-10-24: qty 2

## 2017-10-24 MED ORDER — ACETAMINOPHEN 10 MG/ML IV SOLN
INTRAVENOUS | Status: AC
  Filled 2017-10-24: qty 100

## 2017-10-24 MED ORDER — ONDANSETRON HCL 4 MG/2ML IJ SOLN
INTRAMUSCULAR | Status: DC | PRN
  Administered 2017-10-24 (×2): 4 mg via INTRAVENOUS

## 2017-10-24 MED ORDER — HYDROXYZINE HCL 25 MG PO TABS: 50.0000 mg | ORAL_TABLET | ORAL | Status: DC | PRN

## 2017-10-24 MED ORDER — MIDAZOLAM HCL 2 MG/2ML IJ SOLN
INTRAMUSCULAR | Status: AC
  Filled 2017-10-24: qty 2

## 2017-10-24 MED ORDER — SODIUM CHLORIDE 0.9 % IV SOLN: INTRAVENOUS | Status: DC

## 2017-10-24 MED ORDER — ATORVASTATIN CALCIUM 10 MG PO TABS
10.0000 mg | ORAL_TABLET | Freq: Every day | ORAL | Status: DC
  Administered 2017-10-24: 10 mg via ORAL
  Filled 2017-10-24: qty 1

## 2017-10-24 MED ORDER — MAGNESIUM HYDROXIDE 400 MG/5ML PO SUSP: 30.0000 mL | Freq: Every day | ORAL | Status: DC | PRN

## 2017-10-24 MED ORDER — MIDAZOLAM HCL 5 MG/5ML IJ SOLN
INTRAMUSCULAR | Status: DC | PRN
  Administered 2017-10-24: 2 mg via INTRAVENOUS

## 2017-10-24 MED ORDER — ONDANSETRON HCL 4 MG/2ML IJ SOLN: 4.0000 mg | Freq: Four times a day (QID) | INTRAMUSCULAR | Status: DC | PRN

## 2017-10-24 MED ORDER — SODIUM CHLORIDE 0.9% FLUSH: 3.0000 mL | Freq: Two times a day (BID) | INTRAVENOUS | Status: DC

## 2017-10-24 MED ORDER — OXYCODONE HCL 5 MG PO TABS: 5.0000 mg | ORAL_TABLET | Freq: Once | ORAL | Status: DC | PRN

## 2017-10-24 MED ORDER — SODIUM CHLORIDE 0.9% FLUSH: 3.0000 mL | INTRAVENOUS | Status: DC | PRN

## 2017-10-24 MED ORDER — INSULIN ASPART 100 UNIT/ML ~~LOC~~ SOLN: 0.0000 [IU] | Freq: Every day | SUBCUTANEOUS | Status: DC

## 2017-10-24 MED ORDER — PROPOFOL 10 MG/ML IV BOLUS
INTRAVENOUS | Status: DC | PRN
  Administered 2017-10-24: 200 mg via INTRAVENOUS

## 2017-10-24 MED ORDER — ONDANSETRON HCL 4 MG/2ML IJ SOLN
INTRAMUSCULAR | Status: AC
  Filled 2017-10-24: qty 4

## 2017-10-24 MED ORDER — CYCLOBENZAPRINE HCL 5 MG PO TABS
5.0000 mg | ORAL_TABLET | Freq: Three times a day (TID) | ORAL | Status: DC | PRN
  Administered 2017-10-24: 5 mg via ORAL
  Filled 2017-10-24: qty 1

## 2017-10-24 MED ORDER — THROMBIN 5000 UNITS EX SOLR
CUTANEOUS | Status: AC
  Filled 2017-10-24: qty 5000

## 2017-10-24 MED ORDER — ACETAMINOPHEN 325 MG PO TABS: 650.0000 mg | ORAL_TABLET | ORAL | Status: DC | PRN

## 2017-10-24 MED ORDER — DEXAMETHASONE SODIUM PHOSPHATE 10 MG/ML IJ SOLN
INTRAMUSCULAR | Status: AC
  Filled 2017-10-24: qty 1

## 2017-10-24 MED ORDER — BUPIVACAINE HCL (PF) 0.5 % IJ SOLN
INTRAMUSCULAR | Status: DC | PRN
  Administered 2017-10-24: 15 mL

## 2017-10-24 MED ORDER — INSULIN ASPART 100 UNIT/ML ~~LOC~~ SOLN
SUBCUTANEOUS | Status: AC
  Filled 2017-10-24: qty 1

## 2017-10-24 MED ORDER — ROCURONIUM BROMIDE 50 MG/5ML IV SOSY
PREFILLED_SYRINGE | INTRAVENOUS | Status: DC | PRN
  Administered 2017-10-24 (×3): 50 mg via INTRAVENOUS

## 2017-10-24 MED ORDER — PHENYLEPHRINE HCL 10 MG/ML IJ SOLN
INTRAMUSCULAR | Status: DC | PRN
  Administered 2017-10-24: 120 ug via INTRAVENOUS

## 2017-10-24 MED ORDER — THROMBIN 20000 UNITS EX KIT
PACK | CUTANEOUS | Status: AC
  Filled 2017-10-24: qty 1

## 2017-10-24 MED ORDER — SODIUM CHLORIDE 0.9 % IV SOLN: 250.0000 mL | INTRAVENOUS | Status: DC

## 2017-10-24 MED ORDER — METOPROLOL SUCCINATE ER 25 MG PO TB24
25.0000 mg | ORAL_TABLET | Freq: Every day | ORAL | Status: DC
  Administered 2017-10-25: 25 mg via ORAL
  Filled 2017-10-24: qty 1

## 2017-10-24 MED ORDER — FLEET ENEMA 7-19 GM/118ML RE ENEM: 1.0000 | ENEMA | Freq: Once | RECTAL | Status: DC | PRN

## 2017-10-24 MED ORDER — PROPOFOL 10 MG/ML IV BOLUS
INTRAVENOUS | Status: AC
  Filled 2017-10-24: qty 20

## 2017-10-24 MED ORDER — LIDOCAINE 2% (20 MG/ML) 5 ML SYRINGE
INTRAMUSCULAR | Status: AC
  Filled 2017-10-24: qty 5

## 2017-10-24 MED ORDER — 0.9 % SODIUM CHLORIDE (POUR BTL) OPTIME
TOPICAL | Status: DC | PRN
  Administered 2017-10-24: 1000 mL

## 2017-10-24 MED ORDER — BUPIVACAINE HCL (PF) 0.5 % IJ SOLN
INTRAMUSCULAR | Status: AC
  Filled 2017-10-24: qty 30

## 2017-10-24 MED ORDER — LACTATED RINGERS IV SOLN
INTRAVENOUS | Status: DC
  Administered 2017-10-24 (×2): via INTRAVENOUS

## 2017-10-24 MED ORDER — HYDROCODONE-ACETAMINOPHEN 5-325 MG PO TABS
1.0000 | ORAL_TABLET | ORAL | Status: DC | PRN
  Administered 2017-10-24: 1 via ORAL
  Administered 2017-10-24: 2 via ORAL
  Administered 2017-10-25 (×2): 1 via ORAL
  Filled 2017-10-24 (×3): qty 1
  Filled 2017-10-24: qty 2

## 2017-10-24 MED ORDER — PHENOL 1.4 % MT LIQD: 1.0000 | OROMUCOSAL | Status: DC | PRN

## 2017-10-24 MED ORDER — KETOROLAC TROMETHAMINE 30 MG/ML IJ SOLN
INTRAMUSCULAR | Status: AC
  Filled 2017-10-24: qty 1

## 2017-10-24 MED ORDER — THROMBIN 5000 UNITS EX SOLR
OROMUCOSAL | Status: DC | PRN
  Administered 2017-10-24: 11:00:00 via TOPICAL

## 2017-10-24 MED ORDER — FENTANYL CITRATE (PF) 250 MCG/5ML IJ SOLN
INTRAMUSCULAR | Status: DC | PRN
  Administered 2017-10-24 (×2): 100 ug via INTRAVENOUS
  Administered 2017-10-24: 50 ug via INTRAVENOUS
  Administered 2017-10-24: 150 ug via INTRAVENOUS
  Administered 2017-10-24 (×3): 50 ug via INTRAVENOUS
  Administered 2017-10-24: 100 ug via INTRAVENOUS

## 2017-10-24 MED ORDER — LIDOCAINE-EPINEPHRINE 1 %-1:100000 IJ SOLN
INTRAMUSCULAR | Status: AC
  Filled 2017-10-24: qty 1

## 2017-10-24 MED ORDER — SODIUM CHLORIDE 0.9 % IV SOLN
INTRAVENOUS | Status: DC | PRN
  Administered 2017-10-24: 500 mL

## 2017-10-24 MED ORDER — LIDOCAINE-EPINEPHRINE 1 %-1:100000 IJ SOLN
INTRAMUSCULAR | Status: DC | PRN
  Administered 2017-10-24: 15 mL

## 2017-10-24 MED ORDER — MORPHINE SULFATE (PF) 4 MG/ML IV SOLN: 4.0000 mg | INTRAVENOUS | Status: DC | PRN

## 2017-10-24 MED ORDER — GLIPIZIDE ER 5 MG PO TB24
10.0000 mg | ORAL_TABLET | Freq: Every morning | ORAL | Status: DC
  Administered 2017-10-25: 10 mg via ORAL
  Filled 2017-10-24: qty 2

## 2017-10-24 MED ORDER — KETOROLAC TROMETHAMINE 30 MG/ML IJ SOLN
30.0000 mg | Freq: Once | INTRAMUSCULAR | Status: AC
  Administered 2017-10-24: 30 mg via INTRAVENOUS
  Filled 2017-10-24: qty 1

## 2017-10-24 MED ORDER — INSULIN ASPART 100 UNIT/ML ~~LOC~~ SOLN
5.0000 [IU] | Freq: Once | SUBCUTANEOUS | Status: AC
  Administered 2017-10-24: 5 [IU] via SUBCUTANEOUS

## 2017-10-24 MED ORDER — MENTHOL 3 MG MT LOZG: 1.0000 | LOZENGE | OROMUCOSAL | Status: DC | PRN

## 2017-10-24 MED ORDER — LIDOCAINE 2% (20 MG/ML) 5 ML SYRINGE
INTRAMUSCULAR | Status: DC | PRN
  Administered 2017-10-24: 100 mg via INTRAVENOUS

## 2017-10-24 SURGICAL SUPPLY — 81 items
ADH SKN CLS APL DERMABOND .7 (GAUZE/BANDAGES/DRESSINGS) ×1
APL SKNCLS STERI-STRIP NONHPOA (GAUZE/BANDAGES/DRESSINGS)
BAG DECANTER FOR FLEXI CONT (MISCELLANEOUS) ×2 IMPLANT
BENZOIN TINCTURE PRP APPL 2/3 (GAUZE/BANDAGES/DRESSINGS) ×1 IMPLANT
BLADE CLIPPER SURG (BLADE) ×1 IMPLANT
BUR ACRON 5.0MM COATED (BURR) ×2 IMPLANT
BUR MATCHSTICK NEURO 3.0 LAGG (BURR) ×2 IMPLANT
CAGE LUM TRIT 9X23X12 6D (Cage) ×2 IMPLANT
CANISTER SUCT 3000ML PPV (MISCELLANEOUS) ×2 IMPLANT
CAP LCK SPNE (Orthopedic Implant) ×4 IMPLANT
CAP LOCK SPINE RADIUS (Orthopedic Implant) IMPLANT
CAP LOCKING (Orthopedic Implant) ×8 IMPLANT
CARTRIDGE OIL MAESTRO DRILL (MISCELLANEOUS) ×1 IMPLANT
CONT SPEC 4OZ CLIKSEAL STRL BL (MISCELLANEOUS) ×2 IMPLANT
COVER BACK TABLE 60X90IN (DRAPES) ×2 IMPLANT
COVER WAND RF STERILE (DRAPES) ×1 IMPLANT
DECANTER SPIKE VIAL GLASS SM (MISCELLANEOUS) ×2 IMPLANT
DERMABOND ADVANCED (GAUZE/BANDAGES/DRESSINGS) ×1
DERMABOND ADVANCED .7 DNX12 (GAUZE/BANDAGES/DRESSINGS) ×1 IMPLANT
DIFFUSER DRILL AIR PNEUMATIC (MISCELLANEOUS) ×2 IMPLANT
DRAPE C-ARM 42X72 X-RAY (DRAPES) ×3 IMPLANT
DRAPE C-ARMOR (DRAPES) ×1 IMPLANT
DRAPE HALF SHEET 40X57 (DRAPES) ×2 IMPLANT
DRAPE LAPAROTOMY 100X72X124 (DRAPES) ×2 IMPLANT
DRAPE POUCH INSTRU U-SHP 10X18 (DRAPES) ×2 IMPLANT
ELECT BLADE 4.0 EZ CLEAN MEGAD (MISCELLANEOUS) ×2
ELECT REM PT RETURN 9FT ADLT (ELECTROSURGICAL) ×2
ELECTRODE BLDE 4.0 EZ CLN MEGD (MISCELLANEOUS) IMPLANT
ELECTRODE REM PT RTRN 9FT ADLT (ELECTROSURGICAL) ×1 IMPLANT
GAUZE 4X4 16PLY RFD (DISPOSABLE) IMPLANT
GAUZE SPONGE 4X4 12PLY STRL (GAUZE/BANDAGES/DRESSINGS) ×1 IMPLANT
GAUZE SPONGE 4X4 12PLY STRL LF (GAUZE/BANDAGES/DRESSINGS) ×1 IMPLANT
GLOVE BIOGEL PI IND STRL 6.5 (GLOVE) IMPLANT
GLOVE BIOGEL PI IND STRL 7.0 (GLOVE) IMPLANT
GLOVE BIOGEL PI IND STRL 7.5 (GLOVE) IMPLANT
GLOVE BIOGEL PI IND STRL 8 (GLOVE) ×2 IMPLANT
GLOVE BIOGEL PI INDICATOR 6.5 (GLOVE) ×2
GLOVE BIOGEL PI INDICATOR 7.0 (GLOVE) ×4
GLOVE BIOGEL PI INDICATOR 7.5 (GLOVE) ×3
GLOVE BIOGEL PI INDICATOR 8 (GLOVE) ×2
GLOVE ECLIPSE 6.5 STRL STRAW (GLOVE) ×3 IMPLANT
GLOVE ECLIPSE 7.5 STRL STRAW (GLOVE) ×4 IMPLANT
GOWN STRL REUS W/ TWL LRG LVL3 (GOWN DISPOSABLE) IMPLANT
GOWN STRL REUS W/ TWL XL LVL3 (GOWN DISPOSABLE) ×2 IMPLANT
GOWN STRL REUS W/TWL 2XL LVL3 (GOWN DISPOSABLE) IMPLANT
GOWN STRL REUS W/TWL LRG LVL3 (GOWN DISPOSABLE) ×4
GOWN STRL REUS W/TWL XL LVL3 (GOWN DISPOSABLE) ×6
HEMOSTAT POWDER KIT SURGIFOAM (HEMOSTASIS) ×1 IMPLANT
KIT BASIN OR (CUSTOM PROCEDURE TRAY) ×2 IMPLANT
KIT INFUSE X SMALL 1.4CC (Orthopedic Implant) ×1 IMPLANT
KIT TURNOVER KIT B (KITS) ×2 IMPLANT
NDL ASP BONE MRW 8GX15 (NEEDLE) IMPLANT
NDL SPNL 18GX3.5 QUINCKE PK (NEEDLE) ×1 IMPLANT
NDL SPNL 22GX3.5 QUINCKE BK (NEEDLE) ×1 IMPLANT
NEEDLE ASP BONE MRW 8GX15 (NEEDLE) ×2 IMPLANT
NEEDLE SPNL 18GX3.5 QUINCKE PK (NEEDLE) ×4 IMPLANT
NEEDLE SPNL 22GX3.5 QUINCKE BK (NEEDLE) ×2 IMPLANT
NS IRRIG 1000ML POUR BTL (IV SOLUTION) ×2 IMPLANT
OIL CARTRIDGE MAESTRO DRILL (MISCELLANEOUS) ×2
PACK LAMINECTOMY NEURO (CUSTOM PROCEDURE TRAY) ×2 IMPLANT
PAD ARMBOARD 7.5X6 YLW CONV (MISCELLANEOUS) ×6 IMPLANT
PATTIES SURGICAL .5 X.5 (GAUZE/BANDAGES/DRESSINGS) IMPLANT
PATTIES SURGICAL .5 X1 (DISPOSABLE) IMPLANT
PATTIES SURGICAL 1X1 (DISPOSABLE) ×1 IMPLANT
ROD RADIUS 35MM (Rod) ×2 IMPLANT
SCREW 5.75X45MM (Screw) ×2 IMPLANT
SCREW 5.75X50MM (Screw) ×2 IMPLANT
SPONGE LAP 4X18 RFD (DISPOSABLE) IMPLANT
SPONGE NEURO XRAY DETECT 1X3 (DISPOSABLE) IMPLANT
SPONGE SURGIFOAM ABS GEL 100 (HEMOSTASIS) ×2 IMPLANT
STRIP BIOACTIVE VITOSS 25X100X (Neuro Prosthesis/Implant) ×2 IMPLANT
SUT VIC AB 1 CT1 18XBRD ANBCTR (SUTURE) ×2 IMPLANT
SUT VIC AB 1 CT1 8-18 (SUTURE) ×4
SUT VIC AB 2-0 CP2 18 (SUTURE) ×5 IMPLANT
SYR 3ML LL SCALE MARK (SYRINGE) ×4 IMPLANT
SYR CONTROL 10ML LL (SYRINGE) ×2 IMPLANT
TAPE CLOTH SURG 4X10 WHT LF (GAUZE/BANDAGES/DRESSINGS) ×1 IMPLANT
TOWEL GREEN STERILE (TOWEL DISPOSABLE) ×2 IMPLANT
TOWEL GREEN STERILE FF (TOWEL DISPOSABLE) ×2 IMPLANT
TRAY FOLEY MTR SLVR 16FR STAT (SET/KITS/TRAYS/PACK) ×2 IMPLANT
WATER STERILE IRR 1000ML POUR (IV SOLUTION) ×2 IMPLANT

## 2017-10-24 NOTE — Transfer of Care (Signed)
Immediate Anesthesia Transfer of Care Note  Patient: NORVAL SLAVEN  Procedure(s) Performed: LUMBAR FOUR-LUMBAR FIVE DECOMPRESSION,POSTERIOR LUMBAR INTERBODY FUSION, POSTERIOR LATERAL ARTHRODESIS (N/A Back)  Patient Location: PACU  Anesthesia Type:General  Level of Consciousness: awake, alert , oriented and patient cooperative  Airway & Oxygen Therapy: Patient Spontanous Breathing and Patient connected to nasal cannula oxygen  Post-op Assessment: Report given to RN and Post -op Vital signs reviewed and stable  Post vital signs: Reviewed and stable  Last Vitals:  Vitals Value Taken Time  BP 102/67 10/24/2017  2:01 PM  Temp    Pulse 88 10/24/2017  2:06 PM  Resp 16 10/24/2017  2:06 PM  SpO2 98 % 10/24/2017  2:06 PM  Vitals shown include unvalidated device data.  Last Pain:  Vitals:   10/24/17 0716  TempSrc:   PainSc: 3       Patients Stated Pain Goal: 2 (32/67/12 4580)  Complications: No apparent anesthesia complications

## 2017-10-24 NOTE — Progress Notes (Signed)
Vitals:   10/24/17 1500 10/24/17 1517 10/24/17 1530 10/24/17 1600  BP: 110/68 110/65 105/72 (!) 118/96  Pulse: 77 81 77 83  Resp: 16 18 16 20   Temp:   (!) 97.3 F (36.3 C) 97.7 F (36.5 C)  TempSrc:    Oral  SpO2: 97% 96% 95% 98%  Weight:      Height:        CBC Recent Labs    10/22/17 1054  WBC 9.8  HGB 16.0  HCT 47.4  PLT 215   BMET Recent Labs    10/22/17 1054  NA 138  K 4.5  CL 105  CO2 23  GLUCOSE 152*  BUN 12  CREATININE 0.68  CALCIUM 9.3    Patient has been up and ambulating in the halls.  He is much more comfortable than prior to surgery.  His Foley was DC'd, and the nursing staff is going to monitor his voiding function.  Dressing is clean and dry.    Plan: Doing well following surgery.  Encouraged to ambulate.  Continue to progress through postoperative recovery.  Hosie Spangle, MD 10/24/2017, 5:57 PM

## 2017-10-24 NOTE — Op Note (Signed)
10/24/2017  3:08 PM  PATIENT:  Brian Melton  52 y.o. 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; lumbar spondylosis; lumbar degenerative disease; left L4-5 foraminal disc herniation  PROCEDURE:  Procedure(s): Bilateral L4-5 lumbar decompression including laminectomy, facetectomy, and foraminotomies, for decompression of central canal and neuroforaminal stenosis, with left L4-5 free fragment foraminal microdiscectomy, with decompression beyond that required for interbody arthrodesis; bilateral L4-5 posterior lumbar interbody arthrodesis with Tritanium interbody implants, Vitoss BA with bone marrow aspirate, and infuse; bilateral L4-5 posterior lateral arthrodesis with nonsegmental radius posterior instrumentation, locally harvested morselized autograft, Vitoss BA with bone marrow aspirate, and infuse  SURGEON: Jovita Gamma, MD  ASSISTANTS: Deatra Ina, MD  ANESTHESIA:   general  EBL:  Total I/O In: 1700 [I.V.:1500; IV Piggyback:200] Out: 850 [Urine:550; Blood:300]  BLOOD ADMINISTERED:none  CELL SAVER GIVEN: Cell Saver technician felt that there was insufficient blood loss to return blood to the patient  COUNT:  Correct per nursing staff  DICTATION: Patient is brought to the operating room placed under general endotracheal anesthesia. The patient was turned to prone position the lumbar region was prepped with Betadine soap and solution and draped in a sterile fashion. The midline was infiltrated with local anesthesia with epinephrine. A localizing x-ray was taken and then a midline incision was made carried down through the subcutaneous tissue, bipolar cautery and electrocautery were used to maintain  hemostasis. Dissection was carried down to the lumbar fascia. The fascia was incised bilaterally and the paraspinal muscles were dissected with a spinous process and lamina in a subperiosteal fashion. Another x-ray was taken for localization and the L4-5 level was localized. Dissection was then carried out laterally over the facet complexes.  They were found to be markedly hypertrophic.  Facetectomy was begun with the high-speed drill, and we were then able to visualize and expose the transverse processes of L4 and L5.  The transverse processes were decorticated.  We continued the decompression using the high-speed drill and Kerrison punches.  Bilateral L4 and L5 laminectomies and facetectomies were performed and then foraminotomies with decompression of the stenotic compression of the exiting L4 and L5 nerve roots bilaterally.  We decompressed the left L4 nerve root, a moderately large foraminal free fragment disc herniation was found.  It was compressing the exiting left L4 nerve root.  It was removed with good decompression of the nerve root.  Once the decompression of the stenotic compression of the thecal sac and exiting nerve roots was completed we proceeded with the posterior lumbar interbody arthrodesis. The annulus was incised bilaterally and the disc space entered. A thorough discectomy was performed using pituitary rongeurs and curettes. Once the discectomy was completed we began to prepare the endplate surfaces removing the cartilaginous endplates surface. We then measured the height of the intervertebral disc space. We selected a 12 x 23 x 6 x 9 Tritanium interbody implants.  The C-arm fluoroscope was then draped and brought in the field and we identified the pedicle entry points bilaterally at the L4 and L5 levels. Each of the 4 pedicles was probed, we aspirated bone marrow aspirate from the vertebral bodies, this was injected over two 10 cc strips of Vitoss BA. Then each of the pedicles was  examined with the ball probe good bony surfaces were found and no bony cuts were found. Each  of the pedicles was then tapped with a 5.25 mm tap, again examined with the ball probe good threading was found and no bony cuts were found. We then placed 5.75 x 45 mm screws bilaterally at the L4 level and 5.75 x 45 mm screws bilaterally at the L5 level.  We then packed the Tritanium interbody implants with Vitoss BA with bone marrow aspirate and infuse, and then placed the first implant and on the right side, carefully retracting the thecal sac and nerve root medially. We then went back to the left side and packed the midline with additional Vitoss BA with bone marrow aspirate and infuse, and then placed a second implant and on the left side again retracting the thecal sac and nerve root medially. Additional Vitoss BA with bone marrow aspirate and infuse was packed lateral to the implants.  We then packed the lateral gutter over the transverse processes and intertransverse space with locally harvested morselized autograft, Vitoss BA with bone marrow aspirate, and infuse. We then selected 35 mm pre-lordosed rods, they were placed within the screw heads and secured with locking caps, once all 4 locking caps were placed final tightening was performed against a counter torque.  The wound had been irrigated multiple times during the procedure with saline solution and bacitracin solution, good hemostasis was established with a combination of bipolar cautery and Gelfoam with thrombin.  The Gelfoam was removed, and a thin layer Surgifoam applied.  Once good hemostasis was confirmed we proceeded with closure paraspinal muscles deep fascia and Scarpa's fascia were closed with interrupted undyed 1 Vicryl sutures the subcutaneous and subcuticular closed with interrupted inverted 2-0 undyed Vicryl sutures the skin edges were approximated with Dermabond.  The wound was dressed with sterile gauze and Hypafix.  Following surgery  the patient was turned back to the supine position to be reversed and the anesthetic extubated and transferred to the recovery room for further care.  PLAN OF CARE: Admit to inpatient   PATIENT DISPOSITION:  PACU - hemodynamically stable.   Delay start of Pharmacological VTE agent (>24hrs) due to surgical blood loss or risk of bleeding:  yes

## 2017-10-24 NOTE — H&P (Signed)
Subjective: Patient is a 52 y.o. right-handed white male who is admitted for treatment of advanced L4-5 lumbar spondylosis and degenerative disease, with an associated grade 2 dynamic degenerative spondylolisthesis of L4 on 5, with resulting canal stenosis but bilateral severe neuroforaminal stenosis.  He has had difficulties with low back pain, worse to the left side, but about a month ago developed severe pain in the left-sided low back rating down through the left lower extremity.  Is been treated with NSAIDs and steroid Dosepak without relief.  He is admitted now for lumbar decompression and arthrodesis at the L4-5 level including laminectomy, facetectomy, foraminotomies, posterior lumbar interbody arthrodesis with interbody implants and bone graft, and posterior lateral arthrodesis with posterior instrumentation and bone graft.   Patient Active Problem List   Diagnosis Date Noted  . Diabetes ([place]) 11/10/2015  . Sleep apnea 11/10/2015  . Guaiac positive stools 11/10/2015   Past Medical History:  Diagnosis Date  . Arthritis   . Cancer ([place])    skin cancer  . Diabetes mellitus without complication ([place])   . History of kidney stones   . Sleep apnea    uses cpap  . Umbilical hernia     Past Surgical History:  Procedure Laterality Date  . COLONOSCOPY N/A 11/22/2015   Procedure: COLONOSCOPY;  Surgeon: Rogene Houston, MD;  Location: [facility] SU[facility];  Service: Endoscopy;  Laterality: N/A;  12:15  . ear drum surgery     infant.  . TONSILLECTOMY      Medications Prior to Admission  Medication Sig Dispense Refill Last Dose  . atorvastatin (LIPITOR) 10 MG tablet Take 10 mg by mouth at bedtime.    10/23/2017 at Unknown time  . celecoxib (CELEBREX) 200 MG capsule Take 200 mg by mouth 2 (two) times daily.   Past Week at Unknown time  . cyclobenzaprine (FLEXERIL) 10 MG tablet Take 10 mg by mouth 3 (three) times daily as needed for muscle spasms.   Past Month at Unknown time  . dapagliflozin  propanediol (FARXIGA) 10 MG TABS tablet Take 10 mg by mouth daily.   10/23/2017 at Unknown time  . glipiZIDE (GLUCOTROL XL) 5 MG 24 hr tablet Take 5-10 mg by mouth See admin instructions. Take 10 mg in the morning and 5 mg at bedtime   10/23/2017 at Unknown time  . HYDROcodone-acetaminophen (NORCO/VICODIN) 5-325 MG tablet Take 1 tablet by mouth every 4 (four) hours as needed for moderate pain.   Past Month at Unknown time  . ibuprofen (ADVIL,MOTRIN) 200 MG tablet Take 400 mg by mouth 2 (two) times daily as needed for moderate pain.   Past Week at Unknown time  . losartan (COZAAR) 100 MG tablet Take 100 mg by mouth daily.   10/23/2017 at Unknown time  . metFORMIN (GLUCOPHAGE-XR) 500 MG 24 hr tablet Take 1,000 mg by mouth 2 (two) times daily.   10/23/2017 at Unknown time  . metoprolol succinate (TOPROL-XL) 25 MG 24 hr tablet Take 1 tablet (25 mg total) by mouth daily. 90 tablet 3 10/24/2017 at 0440  . Omega-3 Fatty Acids (FISH OIL) 1200 MG CAPS Take 1,200 mg by mouth 2 (two) times daily.    10/23/2017 at Unknown time  . oxyCODONE-acetaminophen (PERCOCET) 5-325 MG tablet Take 1-2 tablets by mouth every 4 (four) hours as needed. 25 tablet 0 Past Month at Unknown time   Allergies  Allergen Reactions  . Penicillins     UNSPECIFIED CHILDHOOD REACTION  Has patient had a PCN reaction causing immediate  rash, facial/tongue/throat swelling, SOB or lightheadedness with hypotension: No Has patient had a PCN reaction causing severe rash involving mucus membranes or skin necrosis: No Has patient had a PCN reaction that required hospitalization No Has patient had a PCN reaction occurring within the last 10 years: No If all of the above answers are "NO", then may proceed with Cephalosporin use.     Social History   Tobacco Use  . Smoking status: Never Smoker  . Smokeless tobacco: Current User    Types: Chew  Substance Use Topics  . Alcohol use: No    Family History  Problem Relation Age of Onset  . Diabetes  Mother   . Bell's palsy Mother      Review of Systems Pertinent items noted in HPI and remainder of comprehensive ROS otherwise negative.  Objective: Vital signs in last 24 hours: Temp:  [98 F (36.7 C)] 98 F (36.7 C) (10/03 7628) Pulse Rate:  [70] 70 (10/03 0637) Resp:  [18] 18 (10/03 0637) BP: (135)/(87) 135/87 (10/03 0637) SpO2:  [94 %] 94 % (10/03 0637) Weight:  [103.6 kg] 103.6 kg (10/03 0716)  EXAM: Patient well-developed well-nourished white male in no acute distress.   Lungs are clear to auscultation , the patient has symmetrical respiratory excursion. Heart has a regular rate and rhythm normal S1 and S2 no murmur.   Abdomen is soft nontender nondistended bowel sounds are present. Extremity examination shows no clubbing cyanosis or edema. Motor examination shows 5 over 5 strength in the lower extremities including the iliopsoas quadriceps dorsiflexor extensor hallicus  longus and plantar flexor bilaterally, however he has difficulty exerting full effort with the left lower external due to the severity of his pain. Sensation is intact to pinprick in the distal lower extremities. Reflexes are symmetrical bilaterally. No pathologic reflexes are present.  Gait and stance are very unsteady, and he has to hold onto people or furniture to be able to raise up from a seated position or transfer.  Data Review:CBC    Component Value Date/Time   WBC 9.8 10/22/2017 1054   RBC 5.33 10/22/2017 1054   HGB 16.0 10/22/2017 1054   HCT 47.4 10/22/2017 1054   PLT 215 10/22/2017 1054   MCV 88.9 10/22/2017 1054   MCH 30.0 10/22/2017 1054   MCHC 33.8 10/22/2017 1054   RDW 12.6 10/22/2017 1054   LYMPHSABS 1.5 09/23/2017 0152   MONOABS 0.8 09/23/2017 0152   EOSABS 0.3 09/23/2017 0152   BASOSABS 0.1 09/23/2017 0152                          BMET    Component Value Date/Time   NA 138 10/22/2017 1054   K 4.5 10/22/2017 1054   CL 105 10/22/2017 1054   CO2 23 10/22/2017 1054   GLUCOSE 152 (H)  10/22/2017 1054   BUN 12 10/22/2017 1054   CREATININE 0.68 10/22/2017 1054   CALCIUM 9.3 10/22/2017 1054   GFRNONAA >60 10/22/2017 1054   GFRAA >60 10/22/2017 1054     Assessment/Plan: Patient with severe low back and lateral lumbar radiculopathy, left worse than right secondary to advanced degeneration and stenosis at the L4-5 level, contributed to by a grade 2 dynamic degenerative spondylolisthesis.  Patient is admitted now for an L4-5 lumbar decompression arthrodesis.  I've discussed with the patient the nature of his condition, the nature the surgical procedure, the typical length of surgery, hospital stay, and overall recuperation, the limitations postoperatively, and  risks of surgery. I discussed risks including risks of infection, bleeding, possibly need for transfusion, the risk of nerve root dysfunction with pain, weakness, numbness, or paresthesias, the risk of dural tear and CSF leakage and possible need for further surgery, the risk of failure of the arthrodesis and possibly for further surgery, the risk of anesthetic complications including myocardial infarction, stroke, pneumonia, and death. We discussed the need for postoperative immobilization in a lumbar brace. Understanding all this the patient does wish to proceed with surgery and is admitted for such.   Hosie Spangle, MD 10/24/2017 8:53 AM

## 2017-10-24 NOTE — Anesthesia Preprocedure Evaluation (Signed)
Anesthesia Evaluation  Patient identified by MRN, date of birth, ID band Patient awake    Reviewed: Allergy & Precautions, H&P , NPO status , Patient's Chart, lab work & pertinent test results  Airway Mallampati: II   Neck ROM: full    Dental   Pulmonary sleep apnea ,    breath sounds clear to auscultation       Cardiovascular negative cardio ROS   Rhythm:regular Rate:Normal     Neuro/Psych    GI/Hepatic   Endo/Other  diabetes, Type 2obese  Renal/GU stones     Musculoskeletal  (+) Arthritis ,   Abdominal   Peds  Hematology   Anesthesia Other Findings   Reproductive/Obstetrics                             Anesthesia Physical Anesthesia Plan  ASA: II  Anesthesia Plan: General   Post-op Pain Management:    Induction: Intravenous  PONV Risk Score and Plan: 2 and Ondansetron, Dexamethasone, Midazolam and Treatment may vary due to age or medical condition  Airway Management Planned: Oral ETT  Additional Equipment:   Intra-op Plan:   Post-operative Plan: Extubation in OR  Informed Consent: I have reviewed the patients History and Physical, chart, labs and discussed the procedure including the risks, benefits and alternatives for the proposed anesthesia with the patient or authorized representative who has indicated his/her understanding and acceptance.     Plan Discussed with: CRNA, Anesthesiologist and Surgeon  Anesthesia Plan Comments:         Anesthesia Quick Evaluation

## 2017-10-25 LAB — GLUCOSE, CAPILLARY: Glucose-Capillary: 153 mg/dL — ABNORMAL HIGH (ref 70–99)

## 2017-10-25 MED ORDER — HYDROCODONE-ACETAMINOPHEN 5-325 MG PO TABS: 1.0000 | ORAL_TABLET | ORAL | 0 refills | Status: DC | PRN

## 2017-10-25 MED FILL — Heparin Sodium (Porcine) Inj 1000 Unit/ML: INTRAMUSCULAR | Qty: 30 | Status: AC

## 2017-10-25 MED FILL — Sodium Chloride IV Soln 0.9%: INTRAVENOUS | Qty: 1000 | Status: AC

## 2017-10-25 NOTE — Discharge Summary (Signed)
Physician Discharge Summary  Patient ID: Brian Melton MRN: [id number] DOB/AGE: 1965/09/30 52 y.o.  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  Postoperatively he has had excellent relief of his disabling left lumbar radicular pain.  He is up and ambulating actively.  Mild incisional discomfort.  Incision is clean and dry.  There is no erythema, swelling, ecchymosis, or drainage.  He is voiding well.  We are discharging him to home with instructions regarding wound care and activities.  He is to return for follow-up with me in 3 weeks.  Discharge Exam: Blood pressure 106/76, pulse 90, temperature 99.7 F (37.6 C), temperature source Oral, resp. rate 18, height 5\' 10"  (1.778 m), weight 103.6 kg, SpO2 99 %.  Disposition:  Home  Discharge Instructions    Discharge wound care:   Complete by:  As directed    Leave the wound open to air. Shower daily with the wound uncovered. Water and soapy water should run over the incision area. Do not wash directly on the incision for 2 weeks. Remove the glue after 2 weeks.   Driving Restrictions   Complete by:  As directed    No driving for 2 weeks. May ride in the car locally now. May begin to drive locally in 2 weeks.   Other Restrictions   Complete by:  As directed    Walk gradually increasing distances out in the fresh air at least twice a day. Walking additional 6 times inside  the house, gradually increasing distances, daily. No bending, lifting, or twisting. Perform activities between shoulder and waist height (that is at counter height when standing or table height when sitting).     Allergies as of 10/25/2017      Reactions   Penicillins    UNSPECIFIED CHILDHOOD REACTION  Has patient had a PCN reaction causing immediate rash, facial/tongue/throat swelling, SOB or lightheadedness with hypotension: No Has patient had a PCN reaction causing severe rash involving mucus membranes or skin necrosis: No Has patient had a PCN reaction that required hospitalization No Has patient had a PCN reaction occurring within the last 10 years: No If all of the above answers are "NO", then may proceed with Cephalosporin use.      Medication List    TAKE these medications   atorvastatin 10 MG tablet Commonly known as:  LIPITOR Take 10 mg by mouth at bedtime.   celecoxib 200 MG capsule Commonly known as:  CELEBREX Take 200 mg by mouth 2 (two) times daily.   cyclobenzaprine 10 MG tablet Commonly known as:  FLEXERIL Take 10 mg by mouth 3 (three) times daily as needed for muscle spasms.   FARXIGA 10 MG Tabs tablet Generic drug:  dapagliflozin propanediol Take 10 mg by mouth daily.   Fish Oil 1200 MG Caps Take 1,200 mg by mouth 2 (two) times daily.   glipiZIDE 5 MG 24 hr tablet Commonly known as:  GLUCOTROL XL Take 5-10 mg by mouth See admin instructions. Take 10 mg in the morning and 5 mg at bedtime   HYDROcodone-acetaminophen 5-325 MG tablet Commonly known  as:  NORCO/VICODIN Take 1 tablet by mouth every 4 (four) hours as needed for moderate pain. What changed:  Another medication with the same name was added. Make sure you understand how and when to take each.   HYDROcodone-acetaminophen 5-325 MG tablet Commonly known as:  NORCO/VICODIN Take 1-2 tablets by mouth every 4 (four) hours as needed (pain). What changed:  You were already taking a medication with the  same name, and this prescription was added. Make sure you understand how and when to take each.   ibuprofen 200 MG tablet Commonly known as:  ADVIL,MOTRIN Take 400 mg by mouth 2 (two) times daily as needed for moderate pain.   losartan 100 MG tablet Commonly known as:  COZAAR Take 100 mg by mouth daily.   metFORMIN 500 MG 24 hr tablet Commonly known as:  GLUCOPHAGE-XR Take 1,000 mg by mouth 2 (two) times daily.   metoprolol succinate 25 MG 24 hr tablet Commonly known as:  TOPROL-XL Take 1 tablet (25 mg total) by mouth daily.   oxyCODONE-acetaminophen 5-325 MG tablet Commonly known as:  PERCOCET/ROXICET Take 1-2 tablets by mouth every 4 (four) hours as needed.            Discharge Care Instructions  (From admission, onward)         Start     Ordered   10/25/17 0000  Discharge wound care:    Comments:  Leave the wound open to air. Shower daily with the wound uncovered. Water and soapy water should run over the incision area. Do not wash directly on the incision for 2 weeks. Remove the glue after 2 weeks.   10/25/17 7972           Signed: Hosie Spangle 10/25/2017, 8:41 AM

## 2017-10-25 NOTE — Discharge Instructions (Signed)
°  Call Your Doctor If Any of These Occur °Redness, drainage, or swelling at the wound.  °Temperature greater than 101 degrees. °Severe pain not relieved by pain medication. °Incision starts to come apart. °Follow Up Appt °Call today for appointment in 3 weeks [phone]) or for problems.  If you have any hardware placed in your spine, you will need an x-ray before your appointment. °

## 2017-10-25 NOTE — Anesthesia Postprocedure Evaluation (Signed)
Anesthesia Post Note  Patient: Brian Melton  Procedure(s) Performed: LUMBAR FOUR-LUMBAR FIVE DECOMPRESSION,POSTERIOR LUMBAR INTERBODY FUSION, POSTERIOR LATERAL ARTHRODESIS (N/A Back)     Patient location during evaluation: PACU Anesthesia Type: General Level of consciousness: awake and alert Pain management: pain level controlled Vital Signs Assessment: post-procedure vital signs reviewed and stable Respiratory status: spontaneous breathing, nonlabored ventilation, respiratory function stable and patient connected to nasal cannula oxygen Cardiovascular status: blood pressure returned to baseline and stable Postop Assessment: no apparent nausea or vomiting Anesthetic complications: no    Last Vitals:  Vitals:   10/25/17 0402 10/25/17 0753  BP: 102/65 106/76  Pulse: 76 90  Resp: 18   Temp: 36.7 C 37.6 C  SpO2: 96% 99%    Last Pain:  Vitals:   10/25/17 0753  TempSrc: Oral  PainSc:                  [place]

## 2017-10-25 NOTE — Progress Notes (Signed)
Patient alert and oriented, mae's well, voiding adequate amount of urine, swallowing without difficulty, no c/o pain at time of discharge. Patient discharged home with family. Script and discharged instructions given to patient. Patient and family stated understanding of instructions given. Patient has an appointment with Dr. Nudelman 

## 2017-11-12 DIAGNOSIS — I10 Essential (primary) hypertension: Secondary | ICD-10-CM | POA: Diagnosis not present

## 2017-11-12 DIAGNOSIS — E1165 Type 2 diabetes mellitus with hyperglycemia: Secondary | ICD-10-CM | POA: Diagnosis not present

## 2017-11-12 DIAGNOSIS — E1159 Type 2 diabetes mellitus with other circulatory complications: Secondary | ICD-10-CM | POA: Diagnosis not present

## 2017-11-19 NOTE — Telephone Encounter (Signed)
Encounter complete. 

## 2017-11-20 DIAGNOSIS — E1169 Type 2 diabetes mellitus with other specified complication: Secondary | ICD-10-CM | POA: Diagnosis not present

## 2017-11-20 DIAGNOSIS — I10 Essential (primary) hypertension: Secondary | ICD-10-CM | POA: Diagnosis not present

## 2017-11-20 DIAGNOSIS — M1990 Unspecified osteoarthritis, unspecified site: Secondary | ICD-10-CM | POA: Diagnosis not present

## 2017-11-20 DIAGNOSIS — E1159 Type 2 diabetes mellitus with other circulatory complications: Secondary | ICD-10-CM | POA: Diagnosis not present

## 2017-11-25 DIAGNOSIS — M48062 Spinal stenosis, lumbar region with neurogenic claudication: Secondary | ICD-10-CM | POA: Diagnosis not present

## 2018-01-31 DIAGNOSIS — M4316 Spondylolisthesis, lumbar region: Secondary | ICD-10-CM | POA: Diagnosis not present

## 2018-01-31 DIAGNOSIS — M48062 Spinal stenosis, lumbar region with neurogenic claudication: Secondary | ICD-10-CM | POA: Diagnosis not present

## 2018-01-31 DIAGNOSIS — M4726 Other spondylosis with radiculopathy, lumbar region: Secondary | ICD-10-CM | POA: Diagnosis not present

## 2018-01-31 DIAGNOSIS — M5136 Other intervertebral disc degeneration, lumbar region: Secondary | ICD-10-CM | POA: Diagnosis not present

## 2018-01-31 DIAGNOSIS — Z981 Arthrodesis status: Secondary | ICD-10-CM | POA: Diagnosis not present

## 2018-02-19 DIAGNOSIS — E1159 Type 2 diabetes mellitus with other circulatory complications: Secondary | ICD-10-CM | POA: Diagnosis not present

## 2018-02-19 DIAGNOSIS — I10 Essential (primary) hypertension: Secondary | ICD-10-CM | POA: Diagnosis not present

## 2018-02-24 DIAGNOSIS — M1990 Unspecified osteoarthritis, unspecified site: Secondary | ICD-10-CM | POA: Diagnosis not present

## 2018-02-24 DIAGNOSIS — E1159 Type 2 diabetes mellitus with other circulatory complications: Secondary | ICD-10-CM | POA: Diagnosis not present

## 2018-02-24 DIAGNOSIS — E1169 Type 2 diabetes mellitus with other specified complication: Secondary | ICD-10-CM | POA: Diagnosis not present

## 2018-02-24 DIAGNOSIS — E785 Hyperlipidemia, unspecified: Secondary | ICD-10-CM | POA: Diagnosis not present

## 2018-02-24 DIAGNOSIS — I10 Essential (primary) hypertension: Secondary | ICD-10-CM | POA: Diagnosis not present

## 2018-05-30 DIAGNOSIS — Z6834 Body mass index (BMI) 34.0-34.9, adult: Secondary | ICD-10-CM | POA: Diagnosis not present

## 2018-05-30 DIAGNOSIS — Z981 Arthrodesis status: Secondary | ICD-10-CM | POA: Diagnosis not present

## 2018-05-30 DIAGNOSIS — I10 Essential (primary) hypertension: Secondary | ICD-10-CM | POA: Diagnosis not present

## 2018-06-17 DIAGNOSIS — Z1211 Encounter for screening for malignant neoplasm of colon: Secondary | ICD-10-CM | POA: Diagnosis not present

## 2018-06-17 DIAGNOSIS — E1159 Type 2 diabetes mellitus with other circulatory complications: Secondary | ICD-10-CM | POA: Diagnosis not present

## 2018-06-17 DIAGNOSIS — I10 Essential (primary) hypertension: Secondary | ICD-10-CM | POA: Diagnosis not present

## 2018-06-17 DIAGNOSIS — E1169 Type 2 diabetes mellitus with other specified complication: Secondary | ICD-10-CM | POA: Diagnosis not present

## 2018-07-11 ENCOUNTER — Other Ambulatory Visit: Payer: BLUE CROSS/BLUE SHIELD

## 2018-07-11 ENCOUNTER — Other Ambulatory Visit: Payer: Self-pay | Admitting: Internal Medicine

## 2018-07-11 DIAGNOSIS — Z20822 Contact with and (suspected) exposure to covid-19: Secondary | ICD-10-CM

## 2018-07-11 DIAGNOSIS — R6889 Other general symptoms and signs: Secondary | ICD-10-CM | POA: Diagnosis not present

## 2018-07-16 LAB — NOVEL CORONAVIRUS, NAA: SARS-CoV-2, NAA: NOT DETECTED

## 2018-07-18 DIAGNOSIS — E119 Type 2 diabetes mellitus without complications: Secondary | ICD-10-CM | POA: Diagnosis not present

## 2018-07-18 DIAGNOSIS — H11153 Pinguecula, bilateral: Secondary | ICD-10-CM | POA: Diagnosis not present

## 2018-07-18 DIAGNOSIS — H2513 Age-related nuclear cataract, bilateral: Secondary | ICD-10-CM | POA: Diagnosis not present

## 2018-07-18 DIAGNOSIS — H35033 Hypertensive retinopathy, bilateral: Secondary | ICD-10-CM | POA: Diagnosis not present

## 2018-10-05 ENCOUNTER — Other Ambulatory Visit: Payer: Self-pay | Admitting: Cardiology

## 2018-11-02 ENCOUNTER — Other Ambulatory Visit: Payer: Self-pay | Admitting: Cardiology

## 2018-11-28 ENCOUNTER — Other Ambulatory Visit: Payer: Self-pay | Admitting: Cardiology

## 2018-11-29 ENCOUNTER — Other Ambulatory Visit: Payer: Self-pay | Admitting: Cardiology

## 2018-12-19 ENCOUNTER — Other Ambulatory Visit: Payer: Self-pay | Admitting: Cardiology

## 2019-01-16 IMAGING — RF DG LUMBAR SPINE 2-3V
1 series · 2 of 2 positions shown · non-contrast
Comparison: 10/02/2017 radiographs and prior studies

CLINICAL DATA: L4-5 PLIF

EXAM:
DG C-ARM 61-120 MIN; LUMBAR SPINE - 2-3 VIEW

[Series 1: run · 2 of 2 slices shown]
[im 1/2]
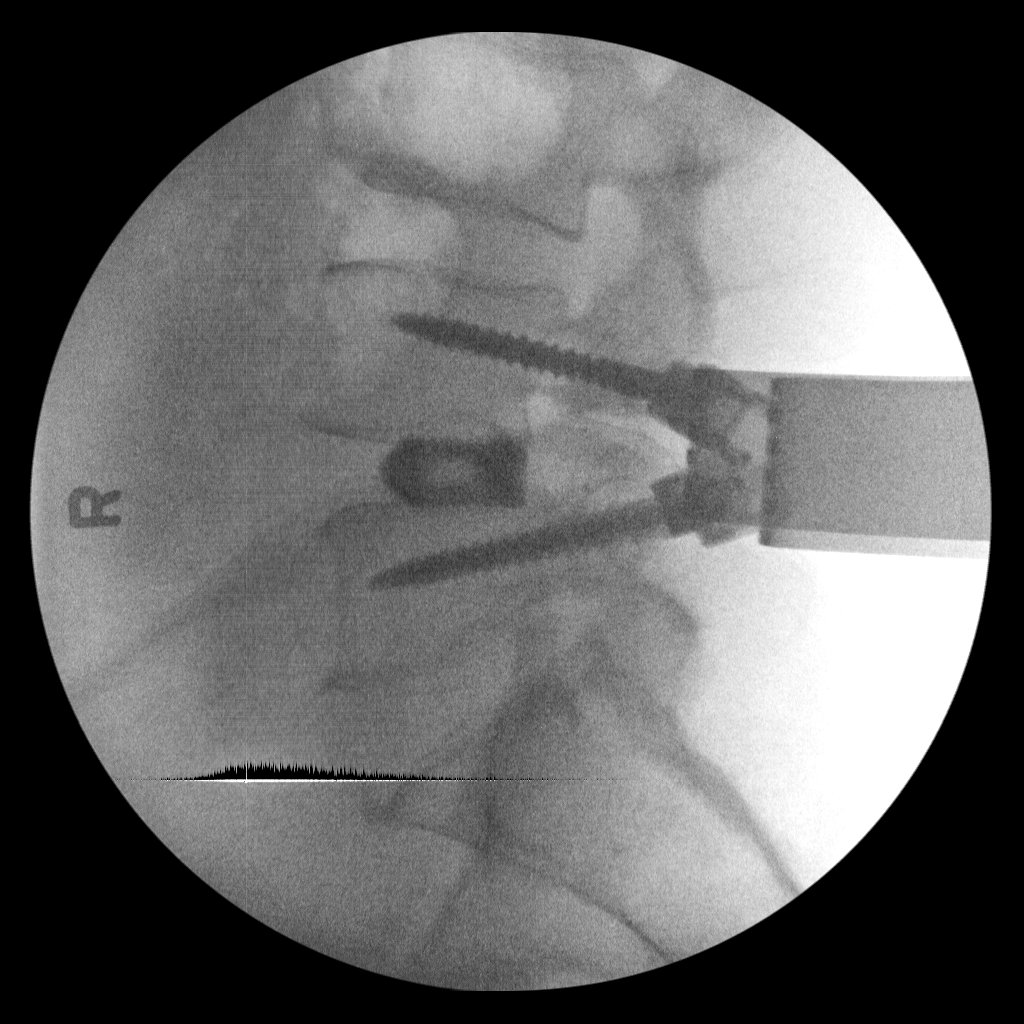
[im 2/2]
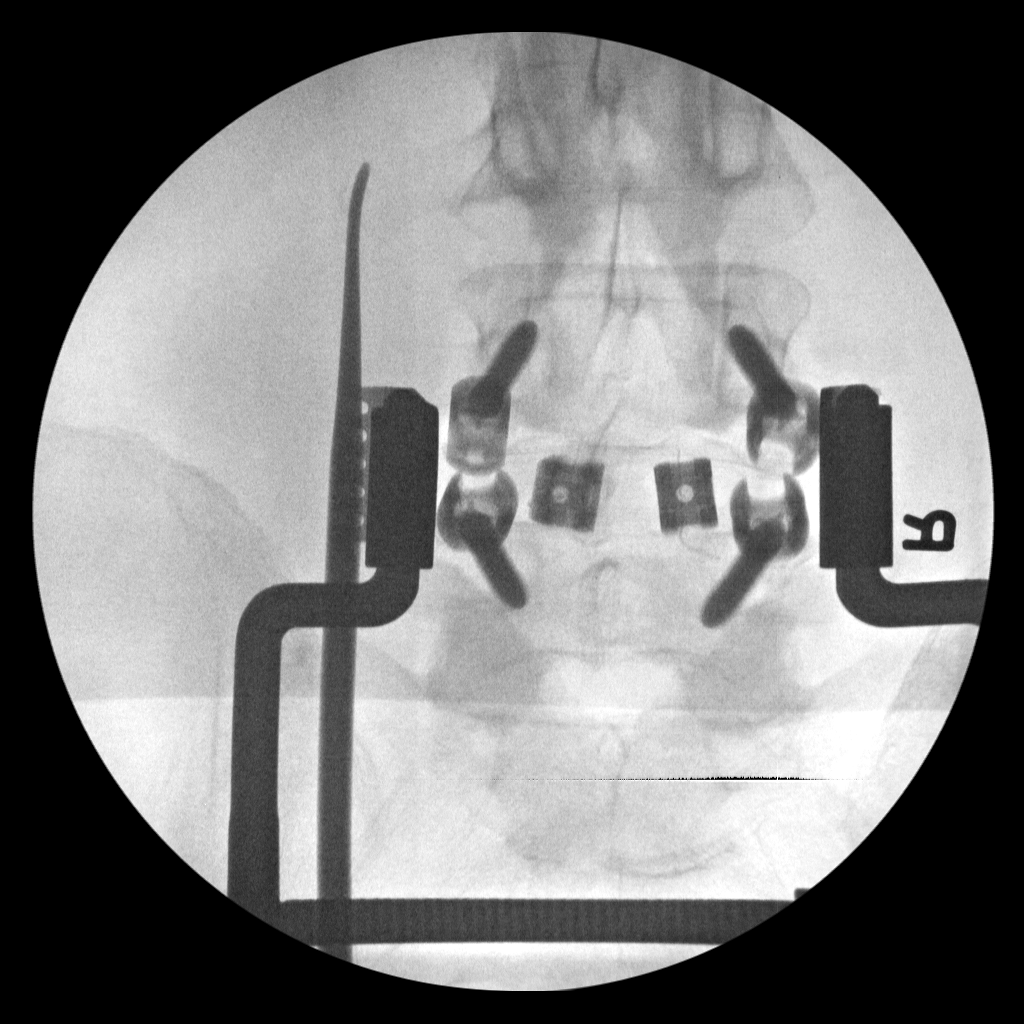

[2 of 2 positions shown; findings below may reference images not displayed]

FINDINGS: Intraoperative spot views of the lumbar spine are submitted
postoperatively for interpretation.

Pedicular screws at L4 and L5 noted with interbody fusion hardware
noted at L4-5.
IMPRESSION: Posterior/interbody fusion hardware at L4-5.

## 2019-02-09 DIAGNOSIS — E7800 Pure hypercholesterolemia, unspecified: Secondary | ICD-10-CM | POA: Diagnosis not present

## 2019-02-09 DIAGNOSIS — I499 Cardiac arrhythmia, unspecified: Secondary | ICD-10-CM | POA: Diagnosis not present

## 2019-02-09 DIAGNOSIS — I10 Essential (primary) hypertension: Secondary | ICD-10-CM | POA: Diagnosis not present

## 2019-02-09 DIAGNOSIS — E1169 Type 2 diabetes mellitus with other specified complication: Secondary | ICD-10-CM | POA: Diagnosis not present

## 2019-04-13 DIAGNOSIS — I10 Essential (primary) hypertension: Secondary | ICD-10-CM | POA: Diagnosis not present

## 2019-04-13 DIAGNOSIS — E7800 Pure hypercholesterolemia, unspecified: Secondary | ICD-10-CM | POA: Diagnosis not present

## 2019-04-13 DIAGNOSIS — E1169 Type 2 diabetes mellitus with other specified complication: Secondary | ICD-10-CM | POA: Diagnosis not present

## 2019-08-07 DIAGNOSIS — E119 Type 2 diabetes mellitus without complications: Secondary | ICD-10-CM | POA: Diagnosis not present

## 2019-08-07 DIAGNOSIS — H11153 Pinguecula, bilateral: Secondary | ICD-10-CM | POA: Diagnosis not present

## 2019-08-07 DIAGNOSIS — H35033 Hypertensive retinopathy, bilateral: Secondary | ICD-10-CM | POA: Diagnosis not present

## 2019-08-07 DIAGNOSIS — H2513 Age-related nuclear cataract, bilateral: Secondary | ICD-10-CM | POA: Diagnosis not present

## 2019-09-04 DIAGNOSIS — Z23 Encounter for immunization: Secondary | ICD-10-CM | POA: Diagnosis not present

## 2019-09-25 DIAGNOSIS — Z23 Encounter for immunization: Secondary | ICD-10-CM | POA: Diagnosis not present

## 2019-10-12 DIAGNOSIS — I10 Essential (primary) hypertension: Secondary | ICD-10-CM | POA: Diagnosis not present

## 2019-10-12 DIAGNOSIS — E1169 Type 2 diabetes mellitus with other specified complication: Secondary | ICD-10-CM | POA: Diagnosis not present

## 2019-10-12 DIAGNOSIS — E7800 Pure hypercholesterolemia, unspecified: Secondary | ICD-10-CM | POA: Diagnosis not present

## 2020-02-04 DIAGNOSIS — Z7984 Long term (current) use of oral hypoglycemic drugs: Secondary | ICD-10-CM | POA: Diagnosis not present

## 2020-02-04 DIAGNOSIS — E1169 Type 2 diabetes mellitus with other specified complication: Secondary | ICD-10-CM | POA: Diagnosis not present

## 2020-04-15 DIAGNOSIS — Z7984 Long term (current) use of oral hypoglycemic drugs: Secondary | ICD-10-CM | POA: Diagnosis not present

## 2020-04-15 DIAGNOSIS — E7800 Pure hypercholesterolemia, unspecified: Secondary | ICD-10-CM | POA: Diagnosis not present

## 2020-04-15 DIAGNOSIS — E1169 Type 2 diabetes mellitus with other specified complication: Secondary | ICD-10-CM | POA: Diagnosis not present

## 2020-04-15 DIAGNOSIS — I10 Essential (primary) hypertension: Secondary | ICD-10-CM | POA: Diagnosis not present

## 2020-08-12 DIAGNOSIS — H5203 Hypermetropia, bilateral: Secondary | ICD-10-CM | POA: Diagnosis not present

## 2020-08-12 DIAGNOSIS — H35033 Hypertensive retinopathy, bilateral: Secondary | ICD-10-CM | POA: Diagnosis not present

## 2020-08-12 DIAGNOSIS — E119 Type 2 diabetes mellitus without complications: Secondary | ICD-10-CM | POA: Diagnosis not present

## 2020-08-12 DIAGNOSIS — H2513 Age-related nuclear cataract, bilateral: Secondary | ICD-10-CM | POA: Diagnosis not present

## 2020-11-07 DIAGNOSIS — M79604 Pain in right leg: Secondary | ICD-10-CM | POA: Diagnosis not present

## 2020-11-07 DIAGNOSIS — E1169 Type 2 diabetes mellitus with other specified complication: Secondary | ICD-10-CM | POA: Diagnosis not present

## 2020-11-07 DIAGNOSIS — Z7984 Long term (current) use of oral hypoglycemic drugs: Secondary | ICD-10-CM | POA: Diagnosis not present

## 2020-11-07 DIAGNOSIS — E7800 Pure hypercholesterolemia, unspecified: Secondary | ICD-10-CM | POA: Diagnosis not present

## 2020-11-07 DIAGNOSIS — I10 Essential (primary) hypertension: Secondary | ICD-10-CM | POA: Diagnosis not present

## 2020-11-10 DIAGNOSIS — M25551 Pain in right hip: Secondary | ICD-10-CM | POA: Diagnosis not present

## 2020-11-17 DIAGNOSIS — M1611 Unilateral primary osteoarthritis, right hip: Secondary | ICD-10-CM | POA: Diagnosis not present

## 2020-12-01 DIAGNOSIS — M25551 Pain in right hip: Secondary | ICD-10-CM | POA: Diagnosis not present

## 2021-01-26 DIAGNOSIS — M1611 Unilateral primary osteoarthritis, right hip: Secondary | ICD-10-CM | POA: Diagnosis not present

## 2021-03-14 DIAGNOSIS — E7800 Pure hypercholesterolemia, unspecified: Secondary | ICD-10-CM | POA: Diagnosis not present

## 2021-03-14 DIAGNOSIS — M1611 Unilateral primary osteoarthritis, right hip: Secondary | ICD-10-CM | POA: Diagnosis not present

## 2021-03-14 DIAGNOSIS — I10 Essential (primary) hypertension: Secondary | ICD-10-CM | POA: Diagnosis not present

## 2021-03-14 DIAGNOSIS — E1169 Type 2 diabetes mellitus with other specified complication: Secondary | ICD-10-CM | POA: Diagnosis not present

## 2021-05-05 NOTE — H&P (Signed)
TOTAL HIP ADMISSION H&P ? ?Patient is admitted for right total hip arthroplasty. ? ?Subjective: ? ?Chief Complaint: Right hip pain ? ?HPI: Brian Melton, 56 y.o. male, has a history of pain and functional disability in the right hip due to arthritis and patient has failed non-surgical conservative treatments for greater than 12 weeks to include NSAID's and/or analgesics and activity modification. Onset of symptoms was gradual, starting  several  years ago with gradually worsening course since that time. The patient noted no past surgery on the right hip. Patient currently rates pain in the right hip at 7 out of 10 with activity. Patient has night pain, worsening of pain with activity and weight bearing, and pain that interfers with activities of daily living. 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 This condition presents safety issues increasing the risk of falls. There is no current active infection. ? ?Patient Active Problem List  ? Diagnosis Date Noted  ? Lumbar stenosis with neurogenic claudication 10/24/2017  ? Diabetes ([place]) 11/10/2015  ? Sleep apnea 11/10/2015  ? Guaiac positive stools 11/10/2015  ? ? ?Past Medical History:  ?Diagnosis Date  ? Arthritis   ? Cancer [facility])   ? skin cancer  ? Diabetes mellitus without complication ([place])   ? History of kidney stones   ? Sleep apnea   ? uses cpap  ? Umbilical hernia   ? ? ?Past Surgical History:  ?Procedure Laterality Date  ? COLONOSCOPY N/A 11/22/2015  ? Procedure: COLONOSCOPY;  Surgeon: Rogene Houston, MD;  Location: [facility];  Service: Endoscopy;  Laterality: N/A;  12:15  ? ear drum surgery    ? infant.  ? TONSILLECTOMY    ? ? ?Prior to Admission medications   ?Medication Sig Start Date End Date Taking? Authorizing Provider  ?atorvastatin (LIPITOR) 10 MG tablet Take 10 mg by mouth at bedtime.     [provider]   ?celecoxib (CELEBREX) 200 MG capsule Take 200 mg by mouth 2 (two) times daily.    [provider]  ?cyclobenzaprine (FLEXERIL) 10 MG tablet Take 10 mg by mouth 3 (three) times daily as needed for muscle spasms.    [provider]  ?dapagliflozin propanediol (FARXIGA) 10 MG TABS tablet Take 10 mg by mouth daily.    [provider]  ?glipiZIDE (GLUCOTROL XL) 5 MG 24 hr tablet Take 5-10 mg by mouth See admin instructions. Take 10 mg in the morning and 5 mg at bedtime    [provider]  ?HYDROcodone-acetaminophen (NORCO/VICODIN) 5-325 MG tablet Take 1 tablet by mouth every 4 (four) hours as needed for moderate pain.    [provider]  ?HYDROcodone-acetaminophen (NORCO/VICODIN) 5-325 MG tablet Take 1-2 tablets by mouth every 4 (four) hours as needed (pain). 10/25/17   Jovita Gamma, MD  ?ibuprofen (ADVIL,MOTRIN) 200 MG tablet Take 400 mg by mouth 2 (two) times daily as needed for moderate pain.    [provider]  ?losartan (COZAAR) 100 MG tablet Take 100 mg by mouth daily.    [provider]  ?metFORMIN (GLUCOPHAGE-XR) 500 MG 24 hr tablet Take 1,000 mg by mouth 2 (two) times daily.    [provider]  ?metoprolol succinate (TOPROL-XL) 25 MG 24 hr tablet Take 1 tablet (25 mg total) by mouth daily. Must call [phone] to schedule an appt to continue refills. Thank you. 1st attempt 12/02/18   Marlou Porch,  Thana Farr, MD  ?Omega-3 Fatty Acids (FISH OIL) 1200 MG CAPS Take 1,200 mg by mouth 2 (two) times daily.     [provider]  ?oxyCODONE-acetaminophen (PERCOCET) 5-325 MG tablet Take 1-2 tablets by mouth every 4 (four) hours as needed. 09/23/17   Orpah Greek, MD  ? ? ?Allergies  ?Allergen Reactions  ? Penicillins   ?  UNSPECIFIED CHILDHOOD REACTION  ?Has patient had a PCN reaction causing immediate rash, facial/tongue/throat swelling, SOB or lightheadedness with hypotension: No ?Has patient had a PCN reaction causing severe rash  involving mucus membranes or skin necrosis: No ?Has patient had a PCN reaction that required hospitalization No ?Has patient had a PCN reaction occurring within the last 10 years: No ?If all of the above answers are "NO", then may proceed with Cephalosporin use. ?  ? ? ?Social History  ? ?Socioeconomic History  ? Marital status: Married  ?  Spouse name: Not on file  ? Number of children: Not on file  ? Years of education: Not on file  ? Highest education level: Not on file  ?Occupational History  ? Not on file  ?Tobacco Use  ? Smoking status: Never  ? Smokeless tobacco: Current  ?  Types: Chew  ?Vaping Use  ? Vaping Use: Never used  ?Substance and Sexual Activity  ? Alcohol use: No  ? Drug use: No  ? Sexual activity: Not on file  ?Other Topics Concern  ? Not on file  ?Social History Narrative  ? Not on file  ? ?Social Determinants of Health  ? ?Financial Resource Strain: Not on file  ?Food Insecurity: Not on file  ?Transportation Needs: Not on file  ?Physical Activity: Not on file  ?Stress: Not on file  ?Social Connections: Not on file  ?Intimate Partner Violence: Not on file  ? ? ?Tobacco Use: Not on file  ? ?Social History  ? ?Substance and Sexual Activity  ?Alcohol Use No  ? ? ?Family History  ?Problem Relation Age of Onset  ? Diabetes Mother   ? Bell's palsy Mother   ? ? ?Review of Systems  ?Constitutional:  Negative for chills and fever.  ?HENT: Negative.    ?Eyes: Negative.   ?Respiratory:  Negative for cough and shortness of breath.   ?Cardiovascular:  Negative for chest pain and palpitations.  ?Gastrointestinal:  Negative for abdominal pain, constipation, diarrhea, nausea and vomiting.  ?Genitourinary:  Negative for dysuria, frequency and urgency.  ?Musculoskeletal:  Positive for joint pain.  ?Skin:  Negative for itching and rash.  ? ? ?Objective: ? ?Physical Exam: ?Well nourished and well developed.  ?General: Alert and oriented x3, cooperative and pleasant, no acute distress.  ?Head: normocephalic,  atraumatic, neck supple.  ?Eyes: EOMI.  ?Respiratory: breath sounds clear in all fields, no wheezing, rales, or rhonchi. ?Cardiovascular: Regular rate and rhythm, no murmurs, gallops or rubs.  ?Abdomen: non-tender to palpation and soft, normoactive bowel sounds. ?Musculoskeletal: ?Antalgic gait pattern favoring the right side without the use of assistive devices.  ? ?Right Hip Exam:  ? Range of motion: Flexion to 100 degrees, internal rotation to 0 degrees, external rotation to 10 to 20 degrees, and abduction to 20 degrees with pain on range of motion.  ? There is no tenderness over the greater trochanter.  ? There is no pain on provocative testing of the hip.  ? ?Left Hip Exam:  ? Range of motion: Normal without discomfort.  ? There is no tenderness over the greater trochanter. ? ?  Calves soft and nontender. Motor function intact in LE. Strength 5/5 LE bilaterally. ?Neuro: Distal pulses 2+. Sensation to light touch intact in LE. ? ?Vital signs in last 24 hours: ?BP: ()/()  ?Arterial Line BP: ()/()  ? 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 The left hip has minimal change. ? ?Plain radiographs demonstrate moderate degenerative joint disease of the right hip. The bone quality appears to be adequate for age and reported activity level. ? ?Assessment/Plan: ? ?End stage arthritis, right hip ? ?The patient history, physical examination, clinical judgement of the provider and imaging studies are consistent with end stage degenerative joint disease of the right hip and total hip arthroplasty is deemed medically necessary. The treatment options including medical management, injection therapy, arthroscopy and arthroplasty were discussed at length. The risks and benefits of total hip arthroplasty were presented and reviewed. The risks due to aseptic  loosening, infection, stiffness, dislocation/subluxation, thromboembolic complications and other imponderables were discussed. The patient acknowledged the explanation, agreed to proceed with the plan and consent was signed.

## 2021-05-12 NOTE — Patient Instructions (Addendum)
DUE TO COVID-19 ONLY TWO VISITORS  (aged 56 and older)  ARE ALLOWED TO COME WITH YOU AND STAY IN THE WAITING ROOM ONLY DURING PRE OP AND PROCEDURE.   ?**NO VISITORS ARE ALLOWED IN THE SHORT STAY AREA OR RECOVERY ROOM!!** ? ?IF YOU WILL BE ADMITTED INTO THE HOSPITAL YOU ARE ALLOWED ONLY FOUR SUPPORT PEOPLE DURING VISITATION HOURS ONLY (7 AM -8PM)   ?The support person(s) must pass our screening, gel in and out, and wear a mask at all times, including in the patient?s room. ?Patients must also wear a mask when staff or their support person are in the room. ?Visitors GUEST BADGE MUST BE WORN VISIBLY  ?One adult visitor may remain with you overnight and MUST be in the room by 8 P.M. ?  ? ? Your procedure is scheduled on: 05/24/21 ? ? Report to [facility] Main Entrance ? ?  Report to admitting at 7:30 AM ? ? Call this number if you have problems the morning of surgery [phone] ? ? Do not eat food :After Midnight. ? ? After Midnight you may have the following liquids until 7:10 AM DAY OF SURGERY ? ?Water ?Black Coffee (sugar ok, NO MILK/CREAM OR CREAMERS)  ?Tea (sugar ok, NO MILK/CREAM OR CREAMERS) regular and decaf                             ?Plain Jell-O (NO RED)                                           ?Fruit ices (not with fruit pulp, NO RED)                                     ?Popsicles (NO RED)                                                                  ?Juice: apple, WHITE grape, WHITE cranberry ?Sports drinks like Gatorade (NO RED) ?Clear broth(vegetable,chicken,beef) ? ?              ?The day of surgery:  ?Drink ONE (1) Pre-Surgery G2 at 7:10 AM the morning of surgery. Drink in one sitting. Do not sip.  ?This drink was given to you during your hospital  ?pre-op appointment visit. ?Nothing else to drink after completing the  ?Pre-Surgery G2. ?  ?       If you have questions, please contact your surgeon?s office. ? ? ?FOLLOW BOWEL PREP AND ANY ADDITIONAL PRE OP INSTRUCTIONS YOU RECEIVED FROM  YOUR SURGEON'S OFFICE!!! ?  ?  ?Oral Hygiene is also important to reduce your risk of infection.                                    ?Remember - BRUSH YOUR TEETH THE MORNING OF SURGERY WITH YOUR REGULAR TOOTHPASTE ? ? Take these medicines the morning of surgery with A SIP OF WATER: Metoprolol ? ?DO NOT  TAKE ANY ORAL DIABETIC MEDICATIONS DAY OF YOUR SURGERY ? ?How to Manage Your Diabetes ?Before and After Surgery ? ?Why is it important to control my blood sugar before and after surgery? ?Improving blood sugar levels before and after surgery helps healing and can limit problems. ?A way of improving blood sugar control is eating a healthy diet by: ? Eating less sugar and carbohydrates ? Increasing activity/exercise ? Talking with your doctor about reaching your blood sugar goals ?High blood sugars (greater than 180 mg/dL) can raise your risk of infections and slow your recovery, so you will need to focus on controlling your diabetes during the weeks before surgery. ?Make sure that the doctor who takes care of your diabetes knows about your planned surgery including the date and location. ? ?How do I manage my blood sugar before surgery? ?Check your blood sugar at least 4 times a day, starting 2 days before surgery, to make sure that the level is not too high or low. ?Check your blood sugar the morning of your surgery when you wake up and every 2 hours until you get to the Short Stay unit. ?If your blood sugar is less than 70 mg/dL, you will need to treat for low blood sugar: ?Do not take insulin. ?Treat a low blood sugar (less than 70 mg/dL) with ? cup of clear juice (cranberry or apple), 4 glucose tablets, OR glucose gel. ?Recheck blood sugar in 15 minutes after treatment (to make sure it is greater than 70 mg/dL). If your blood sugar is not greater than 70 mg/dL on recheck, call [phone] for further instructions. ?Report your blood sugar to the short stay nurse when you get to Short Stay. ? ?If you are admitted to  the hospital after surgery: ?Your blood sugar will be checked by the staff and you will probably be given insulin after surgery (instead of oral diabetes medicines) to make sure you have good blood sugar levels. ?The goal for blood sugar control after surgery is 80-180 mg/dL. ? ? ?WHAT DO I DO ABOUT MY DIABETES MEDICATION? ? ?Do not take oral diabetes medicines (pills) the morning of surgery. ? ?THE DAY BEFORE SURGERY, take Metformin as prescribed. Only take Glipizide in morning. Do not take [place].    ? ? ?THE MORNING OF SURGERY, do not take Metformin, Glipizide, or Farxiga ? ?Reviewed and Endorsed by [facility] Patient Education Committee, August 2015  ?                  ?           You may not have any metal on your body including jewelry, and body piercing ? ?           Do not wear lotions, powders, cologne, or deodorant ? ?            Men may shave face and neck. ? ? Do not bring valuables to the hospital. [facility] NOT ?            RESPONSIBLE   FOR VALUABLES. ? ? Bring small overnight bag day of surgery. ?  ?            Please read over the following fact sheets you were given: IF [place] [phone]- Apolonio Schneiders ? ?   [facility] - Preparing for Surgery ?Before surgery, you can play an important role.  Because skin is not sterile, your skin needs to be as  free of germs as possible.  You can reduce the number of germs on your skin by washing with CHG (chlorahexidine gluconate) soap before surgery.  CHG is an antiseptic cleaner which kills germs and bonds with the skin to continue killing germs even after washing. ?Please DO NOT use if you have an allergy to CHG or antibacterial soaps.  If your skin becomes reddened/irritated stop using the CHG and inform your nurse when you arrive at Short Stay. ?Do not shave (including legs and underarms) for at least 48 hours prior to the first CHG shower.  You may shave your face/neck. ? ?Please follow these  instructions carefully: ? 1.  Shower with CHG Soap the night before surgery and the  morning of surgery. ? 2.  If you choose to wash your hair, wash your hair first as usual with your normal  shampoo. ? 3.  After you shampoo, rinse your hair and body thoroughly to remove the shampoo.                            ? 4.  Use CHG as you would any other liquid soap.  You can apply chg directly to the skin and wash.  Gently with a scrungie or clean washcloth. ? 5.  Apply the CHG Soap to your body ONLY FROM THE NECK DOWN.   Do   not use on face/ open      ?                     Wound or open sores. Avoid contact with eyes, ears mouth and   genitals (private parts).  ?                     Production manager,  Genitals (private parts) with your normal soap. ?            6.  Wash thoroughly, paying special attention to the area where your    surgery  will be performed. ? 7.  Thoroughly rinse your body with warm water from the neck down. ? 8.  DO NOT shower/wash with your normal soap after using and rinsing off the CHG Soap. ?               9.  Pat yourself dry with a clean towel. ?           10.  Wear clean pajamas. ?           11.  Place clean sheets on your bed the night of your first shower and do not  sleep with pets. ?Day of Surgery : ?Do not apply any lotions/deodorants the morning of surgery.  Please wear clean clothes to the hospital/surgery center. ? ?FAILURE TO FOLLOW THESE INSTRUCTIONS MAY RESULT IN THE CANCELLATION OF YOUR SURGERY ? ?PATIENT SIGNATURE_________________________________ ? ?NURSE SIGNATURE__________________________________ ? ?________________________________________________________________________  ? ?Incentive Spirometer ? ?An incentive spirometer is a tool that can help keep your lungs clear and active. This tool measures how well you are filling your lungs with each breath. Taking long deep breaths may help reverse or decrease the chance of developing breathing (pulmonary) problems (especially infection)  following: ?A long period of time when you are unable to move or be active. ?BEFORE THE PROCEDURE  ?If the spirometer includes an indicator to show your best effort, your nurse or respiratory therapist will set it t

## 2021-05-12 NOTE — Progress Notes (Addendum)
COVID Vaccine Completed: yes x2 ?Date COVID Vaccine completed: ?Has received booster: ?COVID vaccine manufacturer   Moderna    ? ?Date of COVID positive in last 90 days: no ? ?PCP - Elesa Hacker ?Cardiologist -  ? ?Chest x-ray - n/a ?EKG - 05/15/21  Epic/chart ?Stress Test - 10/15/17 Epic ?ECHO - 10/14/17 Epic ?Cardiac Cath - n/a ?Pacemaker/ICD device last checked: n/a ?Spinal Cord Stimulator: n/a ? ?Bowel Prep - no ? ?Sleep Study - yes, positive ?CPAP - no, broken ? ?Fasting Blood Sugar - no checks at home ?Checks Blood Sugar  ? ?Blood Thinner Instructions: n/a ?Aspirin Instructions: ?Last Dose: ? ?Activity level: Can go up a flight of stairs and perform activities of daily living without stopping and without symptoms of chest pain or shortness of breath.   ? ?Anesthesia review:  ? ?Patient denies shortness of breath, fever, cough and chest pain at PAT appointment ? ? ?Patient verbalized understanding of instructions that were given to them at the PAT appointment. Patient was also instructed that they will need to review over the PAT instructions again at home before surgery.  ?

## 2021-05-15 ENCOUNTER — Encounter (HOSPITAL_COMMUNITY)
Admission: RE | Admit: 2021-05-15 | Discharge: 2021-05-15 | Disposition: A | Discharge status: Home / Self Care | Payer: BC Managed Care – PPO | Source: Ambulatory Visit | Attending: Orthopedic Surgery | Admitting: Orthopedic Surgery

## 2021-05-15 ENCOUNTER — Encounter (HOSPITAL_COMMUNITY): Payer: Self-pay

## 2021-05-15 VITALS — BP 135/89 | HR 67 | Temp 98.0°F | Resp 18 | Ht 71.0 in | Wt 236.5 lb

## 2021-05-15 DIAGNOSIS — M1611 Unilateral primary osteoarthritis, right hip: Secondary | ICD-10-CM

## 2021-05-15 DIAGNOSIS — E119 Type 2 diabetes mellitus without complications: Secondary | ICD-10-CM | POA: Diagnosis not present

## 2021-05-15 DIAGNOSIS — Z01818 Encounter for other preprocedural examination: Secondary | ICD-10-CM | POA: Diagnosis not present

## 2021-05-15 HISTORY — DX: Pneumonia, unspecified organism: J18.9

## 2021-05-15 HISTORY — DX: Cardiac murmur, unspecified: R01.1

## 2021-05-15 LAB — CBC
HCT: 46.8 % (ref 39.0–52.0)
Hemoglobin: 16.1 g/dL (ref 13.0–17.0)
MCH: 30.7 pg (ref 26.0–34.0)
MCHC: 34.4 g/dL (ref 30.0–36.0)
MCV: 89.1 fL (ref 80.0–100.0)
Platelets: 200 10*3/uL (ref 150–400)
RBC: 5.25 MIL/uL (ref 4.22–5.81)
RDW: 13 % (ref 11.5–15.5)
WBC: 7.5 10*3/uL (ref 4.0–10.5)
nRBC: 0 % (ref 0.0–0.2)

## 2021-05-15 LAB — COMPREHENSIVE METABOLIC PANEL
ALT: 25 U/L (ref 0–44)
AST: 17 U/L (ref 15–41)
Albumin: 4.3 g/dL (ref 3.5–5.0)
Alkaline Phosphatase: 90 U/L (ref 38–126)
Anion gap: 9 (ref 5–15)
BUN: 21 mg/dL — ABNORMAL HIGH (ref 6–20)
CO2: 21 mmol/L — ABNORMAL LOW (ref 22–32)
Calcium: 9.3 mg/dL (ref 8.9–10.3)
Chloride: 107 mmol/L (ref 98–111)
Creatinine, Ser: 0.69 mg/dL (ref 0.61–1.24)
GFR, Estimated: >60 mL/min (ref >60)
Glucose, Bld: 126 mg/dL — ABNORMAL HIGH (ref 70–99)
Potassium: 4.4 mmol/L (ref 3.5–5.1)
Sodium: 137 mmol/L (ref 135–145)
Total Bilirubin: 1.7 mg/dL — ABNORMAL HIGH (ref 0.3–1.2)
Total Protein: 7.5 g/dL (ref 6.5–8.1)

## 2021-05-15 LAB — SURGICAL PCR SCREEN
MRSA, PCR: NEGATIVE
Staphylococcus aureus: NEGATIVE

## 2021-05-15 LAB — HEMOGLOBIN A1C
Hgb A1c MFr Bld: 6.7 % — ABNORMAL HIGH (ref 4.8–5.6)
Mean Plasma Glucose: 145.59 mg/dL

## 2021-05-15 LAB — PROTIME-INR
INR: 1 (ref 0.8–1.2)
Prothrombin Time: 13 seconds (ref 11.4–15.2)

## 2021-05-15 LAB — GLUCOSE, CAPILLARY: Glucose-Capillary: 131 mg/dL — ABNORMAL HIGH (ref 70–99)

## 2021-05-17 NOTE — Progress Notes (Addendum)
Anesthesia Chart Review ? ? Case: [id number] Date/Time: 05/24/21 0955  ? Procedure: TOTAL HIP ARTHROPLASTY ANTERIOR APPROACH (Right: Hip)  ? Anesthesia type: Choice  ? Pre-op diagnosis: Right hip osteoarthritis  ? Location: [facility] ROOM 09 / [facility]  ? Surgeons: Gaynelle Arabian, MD  ? ?  ? ? ?DISCUSSION:56 y.o. never smoker with h/o sleep apnea, DM II, LBBB, nonischemic cardiomyopathy, right hip OA scheduled for above procedure 05/24/2021 with Dr. Gaynelle Arabian.  ? ?Pt seen by cardiology in 2019 for evaluation of new LBBB found during preoperative evaluation.  Echo with EF 40-45%.  Myocardial perfusion with intermediate risk study due to reduced left ventricular systolic function, but without ischemic perfusion abnormalities.  Pt was started on Toprol.  He has followed with PCP since that time.  Pt cleared by PCP, clearance on chart. Discussed with Dr. Gifford Shave.  ? ?Anticipate pt can proceed with planned procedure barring acute status change.   ?VS: BP 135/89   Pulse 67   Temp 36.7 ?C (Oral)   Resp 18   Ht '5\' 11"'$  (1.803 m)   Wt 107.3 kg   SpO2 97%   BMI 32.99 kg/m?  ? ?PROVIDERS: ?[place], [place] ? ? ?LABS: Labs reviewed: Acceptable for surgery. ?(all labs ordered are listed, but only abnormal results are displayed) ? ?Labs Reviewed  ?COMPREHENSIVE METABOLIC PANEL - Abnormal; Notable for the following components:  ?    Result Value  ? CO2 21 (*)   ? Glucose, Bld 126 (*)   ? BUN 21 (*)   ? Total Bilirubin 1.7 (*)   ? All other components within normal limits  ?HEMOGLOBIN A1C - Abnormal; Notable for the following components:  ? Hgb A1c MFr Bld 6.7 (*)   ? All other components within normal limits  ?GLUCOSE, CAPILLARY - Abnormal; Notable for the following components:  ? Glucose-Capillary 131 (*)   ? All other components within normal limits  ?SURGICAL PCR SCREEN  ?CBC  ?PROTIME-INR  ?TYPE AND SCREEN  ? ? ? ?IMAGES: ? ? ?EKG: ?05/15/2021 ?Rate 64 bpm  ?Normal sinus rhythm ?Left axis deviation ?Left bundle  branch block ?Abnormal ECG ?When compared with ECG of 08-Oct-2017 11:56, ?No significant change since last tracing ? ?CV: ?Myocardial Perfusion 10/15/2017 ?The left ventricular ejection fraction is moderately decreased (30-44%). ?Nuclear stress EF: 42%. ?There was no ST segment deviation noted during stress. ?No T wave inversion was noted during stress. ?Defect 1: There is a medium defect of moderate severity present in the basal inferoseptal, mid inferoseptal and apical septal location. ?This is an intermediate risk study. ?  ?Intermediate risk study due to reduced left ventricular systolic function, but without ischemic perfusion abnormalities. ?Fixed moderate inferoseptal defect is consistent with LBBB-related artifact. Consider nonischemic cardiomyopathy. ? ?Echo 10/14/2017 ?Study Conclusions  ? ?- Left ventricle: Diffuse hypokinesis with abnormal septal motion  ?  form LBBB. The cavity size was moderately dilated. Wall thickness  ?  was increased in a pattern of mild LVH. Systolic function was  ?  mildly to moderately reduced. The estimated ejection fraction was  ?  in the range of 40% to 45%. Doppler parameters are consistent  ?  with abnormal left ventricular relaxation (grade 1 diastolic  ?  dysfunction).  ?Past Medical History:  ?Diagnosis Date  ? Arthritis   ? Cancer [facility])   ? skin cancer  ? Diabetes mellitus without complication ([place])   ? Heart murmur   ? RBBB  ? History of kidney  stones   ? Pneumonia   ? Sleep apnea   ? uses cpap  ? Umbilical hernia   ? ? ?Past Surgical History:  ?Procedure Laterality Date  ? BACK SURGERY    ? COLONOSCOPY N/A 11/22/2015  ? Procedure: COLONOSCOPY;  Surgeon: Rogene Houston, MD;  Location: [facility];  Service: Endoscopy;  Laterality: N/A;  12:15  ? ear drum surgery    ? infant.  ? TONSILLECTOMY    ? ? ?MEDICATIONS: ? atorvastatin (LIPITOR) 10 MG tablet  ? cyclobenzaprine (FLEXERIL) 10 MG tablet  ? dapagliflozin propanediol (FARXIGA) 10 MG TABS tablet  ? glipiZIDE  (GLUCOTROL XL) 5 MG 24 hr tablet  ? ibuprofen (ADVIL,MOTRIN) 200 MG tablet  ? losartan (COZAAR) 100 MG tablet  ? meloxicam (MOBIC) 7.5 MG tablet  ? metFORMIN (GLUCOPHAGE-XR) 500 MG 24 hr tablet  ? metoprolol succinate (TOPROL-XL) 25 MG 24 hr tablet  ? pioglitazone (ACTOS) 30 MG tablet  ? ?No current facility-administered medications for this encounter.  ? ? ? ?Konrad Felix Ward, PA-C ?[facility] ?[phone] ? ? ? ? ? ?

## 2021-05-23 NOTE — Anesthesia Preprocedure Evaluation (Addendum)
Anesthesia Evaluation  ?Patient identified by MRN, date of birth, ID band ?Patient awake ? ? ? ?Reviewed: ?Allergy & Precautions, H&P , NPO status , Patient's Chart, lab work & pertinent test results ? ?Airway ?Mallampati: II ? ? ?Neck ROM: full ? ? ? Dental ? ?(+) Teeth Intact, Dental Advisory Given, Missing,  ?  ?Pulmonary ?sleep apnea ,  ?  ?breath sounds clear to auscultation ? ? ? ? ? ? Cardiovascular ?negative cardio ROS ? ? ?Rhythm:regular Rate:Normal ? ? ?  ?Neuro/Psych ?  ? GI/Hepatic ?  ?Endo/Other  ?diabetes, Type 2obese ? Renal/GU ?stones  ? ?  ?Musculoskeletal ? ?(+) Arthritis ,  ? Abdominal ?  ?Peds ? Hematology ?  ?Anesthesia Other Findings ? ? Reproductive/Obstetrics ? ?  ? ? ? ? ? ? ? ? ? ? ? ? ? ?  ?  ? ? ? ? ? ? ? ?Anesthesia Physical ? ?Anesthesia Plan ? ?ASA: 3 ? ?Anesthesia Plan: General  ? ?Post-op Pain Management: Minimal or no pain anticipated and Ofirmev IV (intra-op)*  ? ?Induction: Intravenous ? ?PONV Risk Score and Plan: 2 and Ondansetron, Dexamethasone, Midazolam and Treatment may vary due to age or medical condition ? ?Airway Management Planned: LMA and Oral ETT ? ?Additional Equipment: None ? ?Intra-op Plan:  ? ?Post-operative Plan:  ? ?Informed Consent: I have reviewed the patients History and Physical, chart, labs and discussed the procedure including the risks, benefits and alternatives for the proposed anesthesia with the patient or authorized representative who has indicated his/her understanding and acceptance.  ? ? ? ? ? ?Plan Discussed with: CRNA and Anesthesiologist ? ?Anesthesia Plan Comments: (?DISCUSSION:56 y.o. never smoker with h/o sleep apnea, DM II, LBBB, nonischemic cardiomyopathy, right hip OA scheduled for above procedure 05/24/2021 with Dr. Gaynelle Arabian.  ?? ?Pt seen by cardiology in 2019 for evaluation of new LBBB found during preoperative evaluation.  Echo with EF 40-45%.  Myocardial perfusion with intermediate risk study due to  reduced left ventricular systolic function, but without ischemic perfusion abnormalities.  Pt was started on Toprol.  He has followed with PCP since that time.  Pt cleared by PCP, clearance on chart. Discussed with Dr. Gifford Shave.  ? ?EKG: ?05/15/2021 ?Rate 64 bpm  ?Normal sinus rhythm ?Left axis deviation ?Left bundle branch block ?Abnormal ECG ?When compared with ECG of 08-Oct-2017 11:56, ?No significant change since last tracing ?? ?CV: ?Myocardial Perfusion 10/15/2017 ?The left ventricular ejection fraction is moderately decreased (30-44%). ?Nuclear stress EF: 42%. ?There was no ST segment deviation noted during stress. ?No T wave inversion was noted during stress. ?Defect 1: There is a medium defect of moderate severity present in the basal inferoseptal, mid inferoseptal and apical septal location. ?This is an intermediate risk study. ?? ?Intermediate risk study due to reduced left ventricular systolic function, but without ischemic perfusion abnormalities. ?Fixed moderate inferoseptal defect is consistent with LBBB-related artifact. Consider nonischemic cardiomyopathy. ?? ?Echo 10/14/2017 ?Left ventricle: Diffuse hypokinesis with abnormal septal motion  ???form LBBB. The cavity size was moderately dilated. Wall thickness  ???was increased in a pattern of mild LVH. Systolic function was  ???mildly to moderately reduced. The estimated ejection fraction was  ???in the range of 40% to 45%. Doppler parameters are consistent  ???with abnormal left ventricular relaxation (grade 1 diastolic  ???dysfunction). )  ? ? ? ? ?Anesthesia Quick Evaluation ? ?

## 2021-05-24 ENCOUNTER — Encounter (HOSPITAL_COMMUNITY): Payer: Self-pay | Admitting: Orthopedic Surgery

## 2021-05-24 ENCOUNTER — Other Ambulatory Visit: Payer: Self-pay

## 2021-05-24 ENCOUNTER — Ambulatory Visit (HOSPITAL_COMMUNITY): Payer: BC Managed Care – PPO

## 2021-05-24 ENCOUNTER — Ambulatory Visit (HOSPITAL_COMMUNITY): Payer: BC Managed Care – PPO | Admitting: Certified Registered Nurse Anesthetist

## 2021-05-24 ENCOUNTER — Observation Stay (HOSPITAL_COMMUNITY): Payer: BC Managed Care – PPO

## 2021-05-24 ENCOUNTER — Observation Stay (HOSPITAL_COMMUNITY)
Admission: RE | Admit: 2021-05-24 | Discharge: 2021-05-25 | Disposition: A | Discharge status: Home / Self Care | Payer: BC Managed Care – PPO | Attending: Orthopedic Surgery | Admitting: Orthopedic Surgery

## 2021-05-24 ENCOUNTER — Ambulatory Visit (HOSPITAL_COMMUNITY): Payer: BC Managed Care – PPO | Admitting: Physician Assistant

## 2021-05-24 ENCOUNTER — Encounter (HOSPITAL_COMMUNITY)
Admission: RE | Disposition: A | Discharge status: Home / Self Care | Payer: Self-pay | Source: Home / Self Care | Attending: Orthopedic Surgery

## 2021-05-24 DIAGNOSIS — M1990 Unspecified osteoarthritis, unspecified site: Secondary | ICD-10-CM | POA: Diagnosis not present

## 2021-05-24 DIAGNOSIS — M1611 Unilateral primary osteoarthritis, right hip: Secondary | ICD-10-CM | POA: Diagnosis not present

## 2021-05-24 DIAGNOSIS — M169 Osteoarthritis of hip, unspecified: Secondary | ICD-10-CM | POA: Diagnosis present

## 2021-05-24 DIAGNOSIS — Z471 Aftercare following joint replacement surgery: Secondary | ICD-10-CM | POA: Diagnosis not present

## 2021-05-24 DIAGNOSIS — F17220 Nicotine dependence, chewing tobacco, uncomplicated: Secondary | ICD-10-CM | POA: Insufficient documentation

## 2021-05-24 DIAGNOSIS — E119 Type 2 diabetes mellitus without complications: Secondary | ICD-10-CM | POA: Diagnosis not present

## 2021-05-24 DIAGNOSIS — Z85828 Personal history of other malignant neoplasm of skin: Secondary | ICD-10-CM | POA: Insufficient documentation

## 2021-05-24 DIAGNOSIS — Z7984 Long term (current) use of oral hypoglycemic drugs: Secondary | ICD-10-CM | POA: Insufficient documentation

## 2021-05-24 DIAGNOSIS — G4730 Sleep apnea, unspecified: Secondary | ICD-10-CM | POA: Diagnosis not present

## 2021-05-24 DIAGNOSIS — Z96641 Presence of right artificial hip joint: Secondary | ICD-10-CM | POA: Diagnosis not present

## 2021-05-24 HISTORY — PX: TOTAL HIP ARTHROPLASTY: SHX124

## 2021-05-24 LAB — GLUCOSE, CAPILLARY
Glucose-Capillary: 199 mg/dL — ABNORMAL HIGH (ref 70–99)
Glucose-Capillary: 209 mg/dL — ABNORMAL HIGH (ref 70–99)
Glucose-Capillary: 260 mg/dL — ABNORMAL HIGH (ref 70–99)
Glucose-Capillary: 174 mg/dL — ABNORMAL HIGH (ref 70–99)

## 2021-05-24 LAB — TYPE AND SCREEN
ABO/RH(D): O POS
Antibody Screen: NEGATIVE

## 2021-05-24 SURGERY — ARTHROPLASTY, HIP, TOTAL, ANTERIOR APPROACH
Anesthesia: General | Site: Hip | Laterality: Right

## 2021-05-24 MED ORDER — 0.9 % SODIUM CHLORIDE (POUR BTL) OPTIME
TOPICAL | Status: DC | PRN
  Administered 2021-05-24: 1000 mL

## 2021-05-24 MED ORDER — METHOCARBAMOL 500 MG PO TABS
500.0000 mg | ORAL_TABLET | Freq: Four times a day (QID) | ORAL | Status: DC | PRN
  Administered 2021-05-24 – 2021-05-25 (×3): 500 mg via ORAL
  Filled 2021-05-24 (×3): qty 1

## 2021-05-24 MED ORDER — BUPIVACAINE-EPINEPHRINE (PF) 0.25% -1:200000 IJ SOLN
INTRAMUSCULAR | Status: AC
  Filled 2021-05-24: qty 30

## 2021-05-24 MED ORDER — HYDROMORPHONE HCL 1 MG/ML IJ SOLN
INTRAMUSCULAR | Status: DC | PRN
  Administered 2021-05-24: .4 mg via INTRAVENOUS

## 2021-05-24 MED ORDER — ACETAMINOPHEN 10 MG/ML IV SOLN
1000.0000 mg | Freq: Four times a day (QID) | INTRAVENOUS | Status: DC
  Administered 2021-05-24: 1000 mg via INTRAVENOUS
  Filled 2021-05-24: qty 100

## 2021-05-24 MED ORDER — ONDANSETRON HCL 4 MG/2ML IJ SOLN: 4.0000 mg | Freq: Four times a day (QID) | INTRAMUSCULAR | Status: DC | PRN

## 2021-05-24 MED ORDER — CHLORHEXIDINE GLUCONATE 0.12 % MT SOLN
15.0000 mL | Freq: Once | OROMUCOSAL | Status: AC
  Administered 2021-05-24: 15 mL via OROMUCOSAL

## 2021-05-24 MED ORDER — POVIDONE-IODINE 10 % EX SWAB
2.0000 "application " | Freq: Once | CUTANEOUS | Status: AC
  Administered 2021-05-24: 2 via TOPICAL

## 2021-05-24 MED ORDER — OXYCODONE HCL 5 MG PO TABS: 5.0000 mg | ORAL_TABLET | Freq: Once | ORAL | Status: DC | PRN

## 2021-05-24 MED ORDER — MIDAZOLAM HCL 2 MG/2ML IJ SOLN
INTRAMUSCULAR | Status: AC
  Filled 2021-05-24: qty 2

## 2021-05-24 MED ORDER — FLEET ENEMA 7-19 GM/118ML RE ENEM: 1.0000 | ENEMA | Freq: Once | RECTAL | Status: DC | PRN

## 2021-05-24 MED ORDER — ACETAMINOPHEN 160 MG/5ML PO SOLN: 325.0000 mg | ORAL | Status: DC | PRN

## 2021-05-24 MED ORDER — ACETAMINOPHEN 325 MG PO TABS: 325.0000 mg | ORAL_TABLET | Freq: Four times a day (QID) | ORAL | Status: DC | PRN

## 2021-05-24 MED ORDER — PHENOL 1.4 % MT LIQD: 1.0000 | OROMUCOSAL | Status: DC | PRN

## 2021-05-24 MED ORDER — ONDANSETRON HCL 4 MG/2ML IJ SOLN
INTRAMUSCULAR | Status: DC | PRN
  Administered 2021-05-24: 4 mg via INTRAVENOUS

## 2021-05-24 MED ORDER — CEFAZOLIN SODIUM-DEXTROSE 2-4 GM/100ML-% IV SOLN
2.0000 g | Freq: Four times a day (QID) | INTRAVENOUS | Status: AC
  Administered 2021-05-24 (×2): 2 g via INTRAVENOUS
  Filled 2021-05-24 (×2): qty 100

## 2021-05-24 MED ORDER — SODIUM CHLORIDE 0.9 % IV SOLN: INTRAVENOUS | Status: DC

## 2021-05-24 MED ORDER — INSULIN ASPART 100 UNIT/ML IJ SOLN: 0.0000 [IU] | Freq: Every day | INTRAMUSCULAR | Status: DC

## 2021-05-24 MED ORDER — MENTHOL 3 MG MT LOZG: 1.0000 | LOZENGE | OROMUCOSAL | Status: DC | PRN

## 2021-05-24 MED ORDER — PHENYLEPHRINE 80 MCG/ML (10ML) SYRINGE FOR IV PUSH (FOR BLOOD PRESSURE SUPPORT)
PREFILLED_SYRINGE | INTRAVENOUS | Status: DC | PRN
  Administered 2021-05-24: 80 ug via INTRAVENOUS

## 2021-05-24 MED ORDER — DOCUSATE SODIUM 100 MG PO CAPS
100.0000 mg | ORAL_CAPSULE | Freq: Two times a day (BID) | ORAL | Status: DC
  Administered 2021-05-24 – 2021-05-25 (×2): 100 mg via ORAL
  Filled 2021-05-24 (×2): qty 1

## 2021-05-24 MED ORDER — TRANEXAMIC ACID-NACL 1000-0.7 MG/100ML-% IV SOLN
1000.0000 mg | INTRAVENOUS | Status: AC
  Administered 2021-05-24: 1000 mg via INTRAVENOUS
  Filled 2021-05-24: qty 100

## 2021-05-24 MED ORDER — EPHEDRINE SULFATE-NACL 50-0.9 MG/10ML-% IV SOSY
PREFILLED_SYRINGE | INTRAVENOUS | Status: DC | PRN
  Administered 2021-05-24 (×2): 7.5 mg via INTRAVENOUS

## 2021-05-24 MED ORDER — ONDANSETRON HCL 4 MG/2ML IJ SOLN
INTRAMUSCULAR | Status: AC
  Filled 2021-05-24: qty 2

## 2021-05-24 MED ORDER — PHENYLEPHRINE HCL-NACL 20-0.9 MG/250ML-% IV SOLN
INTRAVENOUS | Status: AC
  Filled 2021-05-24: qty 500

## 2021-05-24 MED ORDER — HYDROMORPHONE HCL 1 MG/ML IJ SOLN
INTRAMUSCULAR | Status: AC
  Administered 2021-05-24: 0.5 mg via INTRAVENOUS
  Filled 2021-05-24: qty 2

## 2021-05-24 MED ORDER — FENTANYL CITRATE PF 50 MCG/ML IJ SOSY
25.0000 ug | PREFILLED_SYRINGE | INTRAMUSCULAR | Status: DC | PRN
  Administered 2021-05-24 (×3): 50 ug via INTRAVENOUS

## 2021-05-24 MED ORDER — FENTANYL CITRATE PF 50 MCG/ML IJ SOSY
PREFILLED_SYRINGE | INTRAMUSCULAR | Status: AC
  Filled 2021-05-24: qty 3

## 2021-05-24 MED ORDER — GLIPIZIDE ER 5 MG PO TB24
10.0000 mg | ORAL_TABLET | Freq: Two times a day (BID) | ORAL | Status: DC
  Administered 2021-05-24 – 2021-05-25 (×2): 10 mg via ORAL
  Filled 2021-05-24 (×2): qty 2

## 2021-05-24 MED ORDER — DIPHENHYDRAMINE HCL 12.5 MG/5ML PO ELIX: 12.5000 mg | ORAL_SOLUTION | ORAL | Status: DC | PRN

## 2021-05-24 MED ORDER — LACTATED RINGERS IV SOLN: INTRAVENOUS | Status: DC

## 2021-05-24 MED ORDER — OXYCODONE HCL 5 MG/5ML PO SOLN: 5.0000 mg | Freq: Once | ORAL | Status: DC | PRN

## 2021-05-24 MED ORDER — LOSARTAN POTASSIUM 50 MG PO TABS
100.0000 mg | ORAL_TABLET | Freq: Every day | ORAL | Status: DC
  Administered 2021-05-25: 100 mg via ORAL
  Filled 2021-05-24: qty 2

## 2021-05-24 MED ORDER — ONDANSETRON HCL 4 MG/2ML IJ SOLN: 4.0000 mg | Freq: Once | INTRAMUSCULAR | Status: DC | PRN

## 2021-05-24 MED ORDER — DEXAMETHASONE SODIUM PHOSPHATE 10 MG/ML IJ SOLN
8.0000 mg | Freq: Once | INTRAMUSCULAR | Status: AC
  Administered 2021-05-24: 4 mg via INTRAVENOUS

## 2021-05-24 MED ORDER — WATER FOR IRRIGATION, STERILE IR SOLN
Status: DC | PRN
  Administered 2021-05-24: 2000 mL

## 2021-05-24 MED ORDER — ONDANSETRON HCL 4 MG PO TABS: 4.0000 mg | ORAL_TABLET | Freq: Four times a day (QID) | ORAL | Status: DC | PRN

## 2021-05-24 MED ORDER — TRAMADOL HCL 50 MG PO TABS
50.0000 mg | ORAL_TABLET | Freq: Four times a day (QID) | ORAL | Status: DC | PRN
  Administered 2021-05-24 – 2021-05-25 (×3): 100 mg via ORAL
  Filled 2021-05-24 (×3): qty 2

## 2021-05-24 MED ORDER — MEPERIDINE HCL 50 MG/ML IJ SOLN: 6.2500 mg | INTRAMUSCULAR | Status: DC | PRN

## 2021-05-24 MED ORDER — METOPROLOL SUCCINATE ER 25 MG PO TB24
25.0000 mg | ORAL_TABLET | Freq: Every day | ORAL | Status: DC
  Administered 2021-05-25: 25 mg via ORAL
  Filled 2021-05-24: qty 1

## 2021-05-24 MED ORDER — METHOCARBAMOL 500 MG IVPB - SIMPLE MED
500.0000 mg | Freq: Four times a day (QID) | INTRAVENOUS | Status: DC | PRN
  Administered 2021-05-24: 500 mg via INTRAVENOUS
  Filled 2021-05-24: qty 50

## 2021-05-24 MED ORDER — CEFAZOLIN SODIUM-DEXTROSE 2-4 GM/100ML-% IV SOLN
2.0000 g | INTRAVENOUS | Status: AC
  Administered 2021-05-24: 2 g via INTRAVENOUS
  Filled 2021-05-24: qty 100

## 2021-05-24 MED ORDER — BUPIVACAINE-EPINEPHRINE (PF) 0.25% -1:200000 IJ SOLN
INTRAMUSCULAR | Status: DC | PRN
  Administered 2021-05-24: 30 mL

## 2021-05-24 MED ORDER — FENTANYL CITRATE (PF) 100 MCG/2ML IJ SOLN
INTRAMUSCULAR | Status: AC
  Filled 2021-05-24: qty 2

## 2021-05-24 MED ORDER — SUGAMMADEX SODIUM 500 MG/5ML IV SOLN
INTRAVENOUS | Status: AC
  Filled 2021-05-24: qty 5

## 2021-05-24 MED ORDER — HYDROCODONE-ACETAMINOPHEN 5-325 MG PO TABS
1.0000 | ORAL_TABLET | ORAL | Status: DC | PRN
  Administered 2021-05-25: 1 via ORAL
  Filled 2021-05-24 (×2): qty 2

## 2021-05-24 MED ORDER — POLYETHYLENE GLYCOL 3350 17 G PO PACK: 17.0000 g | PACK | Freq: Every day | ORAL | Status: DC | PRN

## 2021-05-24 MED ORDER — ATORVASTATIN CALCIUM 10 MG PO TABS: 10.0000 mg | ORAL_TABLET | Freq: Every day | ORAL | Status: DC

## 2021-05-24 MED ORDER — MORPHINE SULFATE (PF) 2 MG/ML IV SOLN: 0.5000 mg | INTRAVENOUS | Status: DC | PRN

## 2021-05-24 MED ORDER — HYDROMORPHONE HCL 1 MG/ML IJ SOLN
0.5000 mg | INTRAMUSCULAR | Status: AC | PRN
  Administered 2021-05-24 (×3): 0.5 mg via INTRAVENOUS

## 2021-05-24 MED ORDER — MIDAZOLAM HCL 5 MG/5ML IJ SOLN
INTRAMUSCULAR | Status: DC | PRN
  Administered 2021-05-24: 2 mg via INTRAVENOUS

## 2021-05-24 MED ORDER — METOCLOPRAMIDE HCL 5 MG PO TABS: 5.0000 mg | ORAL_TABLET | Freq: Three times a day (TID) | ORAL | Status: DC | PRN

## 2021-05-24 MED ORDER — SUGAMMADEX SODIUM 500 MG/5ML IV SOLN
INTRAVENOUS | Status: DC | PRN
  Administered 2021-05-24: 220 mg via INTRAVENOUS

## 2021-05-24 MED ORDER — BISACODYL 10 MG RE SUPP: 10.0000 mg | Freq: Every day | RECTAL | Status: DC | PRN

## 2021-05-24 MED ORDER — ASPIRIN EC 325 MG PO TBEC
325.0000 mg | DELAYED_RELEASE_TABLET | Freq: Two times a day (BID) | ORAL | Status: DC
  Administered 2021-05-25: 325 mg via ORAL
  Filled 2021-05-24: qty 1

## 2021-05-24 MED ORDER — PROPOFOL 10 MG/ML IV BOLUS
INTRAVENOUS | Status: DC | PRN
  Administered 2021-05-24: 150 mg via INTRAVENOUS

## 2021-05-24 MED ORDER — METOCLOPRAMIDE HCL 5 MG/ML IJ SOLN: 5.0000 mg | Freq: Three times a day (TID) | INTRAMUSCULAR | Status: DC | PRN

## 2021-05-24 MED ORDER — METHOCARBAMOL 500 MG IVPB - SIMPLE MED
INTRAVENOUS | Status: AC
  Filled 2021-05-24: qty 50

## 2021-05-24 MED ORDER — PROPOFOL 10 MG/ML IV BOLUS
INTRAVENOUS | Status: AC
  Filled 2021-05-24: qty 20

## 2021-05-24 MED ORDER — DAPAGLIFLOZIN PROPANEDIOL 10 MG PO TABS
10.0000 mg | ORAL_TABLET | Freq: Every day | ORAL | Status: DC
  Administered 2021-05-25: 10 mg via ORAL
  Filled 2021-05-24: qty 1

## 2021-05-24 MED ORDER — DEXAMETHASONE SODIUM PHOSPHATE 10 MG/ML IJ SOLN
INTRAMUSCULAR | Status: AC
  Filled 2021-05-24: qty 1

## 2021-05-24 MED ORDER — HYDROMORPHONE HCL 2 MG/ML IJ SOLN
INTRAMUSCULAR | Status: AC
  Filled 2021-05-24: qty 1

## 2021-05-24 MED ORDER — ACETAMINOPHEN 325 MG PO TABS: 325.0000 mg | ORAL_TABLET | ORAL | Status: DC | PRN

## 2021-05-24 MED ORDER — INSULIN ASPART 100 UNIT/ML IJ SOLN
0.0000 [IU] | Freq: Three times a day (TID) | INTRAMUSCULAR | Status: DC
  Administered 2021-05-24: 8 [IU] via SUBCUTANEOUS
  Administered 2021-05-25: 3 [IU] via SUBCUTANEOUS

## 2021-05-24 MED ORDER — FENTANYL CITRATE (PF) 100 MCG/2ML IJ SOLN
INTRAMUSCULAR | Status: DC | PRN
  Administered 2021-05-24 (×6): 50 ug via INTRAVENOUS

## 2021-05-24 MED ORDER — ORAL CARE MOUTH RINSE: 15.0000 mL | Freq: Once | OROMUCOSAL | Status: AC

## 2021-05-24 MED ORDER — ROCURONIUM BROMIDE 10 MG/ML (PF) SYRINGE
PREFILLED_SYRINGE | INTRAVENOUS | Status: DC | PRN
Start: 2021-05-24 — End: 2021-05-24
  Administered 2021-05-24: 30 mg via INTRAVENOUS
  Administered 2021-05-24: 50 mg via INTRAVENOUS

## 2021-05-24 MED ORDER — PIOGLITAZONE HCL 30 MG PO TABS
30.0000 mg | ORAL_TABLET | Freq: Every day | ORAL | Status: DC
  Filled 2021-05-24 (×2): qty 1

## 2021-05-24 SURGICAL SUPPLY — 48 items
BAG COUNTER SPONGE SURGICOUNT (BAG) ×1 IMPLANT
BAG DECANTER FOR FLEXI CONT (MISCELLANEOUS) IMPLANT
BAG SPEC THK2 15X12 ZIP CLS (MISCELLANEOUS)
BAG SPNG CNTER NS LX DISP (BAG) ×1
BAG ZIPLOCK 12X15 (MISCELLANEOUS) IMPLANT
BLADE SAG 18X100X1.27 (BLADE) ×3 IMPLANT
COVER PERINEAL POST (MISCELLANEOUS) ×3 IMPLANT
COVER SURGICAL LIGHT HANDLE (MISCELLANEOUS) ×3 IMPLANT
CUP ACETBLR 52 OD PINNACLE (Hips) ×1 IMPLANT
DRAPE FOOT SWITCH (DRAPES) ×3 IMPLANT
DRAPE STERI IOBAN 125X83 (DRAPES) ×3 IMPLANT
DRAPE U-SHAPE 47X51 STRL (DRAPES) ×6 IMPLANT
DRSG AQUACEL AG ADV 3.5X 6 (GAUZE/BANDAGES/DRESSINGS) ×1 IMPLANT
DRSG AQUACEL AG ADV 3.5X10 (GAUZE/BANDAGES/DRESSINGS) ×2 IMPLANT
DURAPREP 26ML APPLICATOR (WOUND CARE) ×3 IMPLANT
ELECT REM PT RETURN 15FT ADLT (MISCELLANEOUS) ×3 IMPLANT
GLOVE BIO SURGEON STRL SZ 6.5 (GLOVE) IMPLANT
GLOVE BIO SURGEON STRL SZ7.5 (GLOVE) ×1 IMPLANT
GLOVE BIO SURGEON STRL SZ8 (GLOVE) ×3 IMPLANT
GLOVE BIOGEL PI IND STRL 6.5 (GLOVE) IMPLANT
GLOVE BIOGEL PI IND STRL 7.0 (GLOVE) IMPLANT
GLOVE BIOGEL PI IND STRL 8 (GLOVE) ×2 IMPLANT
GLOVE BIOGEL PI INDICATOR 6.5 (GLOVE)
GLOVE BIOGEL PI INDICATOR 7.0 (GLOVE)
GLOVE BIOGEL PI INDICATOR 8 (GLOVE) ×1
GOWN STRL REUS W/ TWL LRG LVL3 (GOWN DISPOSABLE) ×2 IMPLANT
GOWN STRL REUS W/ TWL XL LVL3 (GOWN DISPOSABLE) IMPLANT
GOWN STRL REUS W/TWL LRG LVL3 (GOWN DISPOSABLE) ×2
GOWN STRL REUS W/TWL XL LVL3 (GOWN DISPOSABLE) ×2
HEAD FEMORAL 32 CERAMIC (Hips) ×1 IMPLANT
HOLDER FOLEY CATH W/STRAP (MISCELLANEOUS) ×2 IMPLANT
KIT TURNOVER KIT A (KITS) ×1 IMPLANT
LINER MARATHON NEUT +4X52X32 (Hips) ×1 IMPLANT
MANIFOLD NEPTUNE II (INSTRUMENTS) ×3 IMPLANT
PACK ANTERIOR HIP CUSTOM (KITS) ×3 IMPLANT
PENCIL SMOKE EVACUATOR COATED (MISCELLANEOUS) ×3 IMPLANT
SPIKE FLUID TRANSFER (MISCELLANEOUS) ×2 IMPLANT
SPONGE T-LAP 18X18 ~~LOC~~+RFID (SPONGE) ×6 IMPLANT
STEM FEM ACTIS STD SZ7 (Nail) ×1 IMPLANT
STRIP CLOSURE SKIN 1/2X4 (GAUZE/BANDAGES/DRESSINGS) ×4 IMPLANT
SUT ETHIBOND NAB CT1 #1 30IN (SUTURE) ×3 IMPLANT
SUT MNCRL AB 4-0 PS2 18 (SUTURE) ×3 IMPLANT
SUT STRATAFIX 0 PDS 27 VIOLET (SUTURE) ×2
SUT VIC AB 2-0 CT1 27 (SUTURE) ×4
SUT VIC AB 2-0 CT1 TAPERPNT 27 (SUTURE) ×4 IMPLANT
SUTURE STRATFX 0 PDS 27 VIOLET (SUTURE) ×2 IMPLANT
TRAY FOLEY MTR SLVR 16FR STAT (SET/KITS/TRAYS/PACK) ×2 IMPLANT
TUBE SUCTION HIGH CAP CLEAR NV (SUCTIONS) ×3 IMPLANT

## 2021-05-24 NOTE — Discharge Instructions (Addendum)
?Brian Arabian, MD ?Total Joint Specialist ?[facility] ?[address] ?[place], [place] [address] ?[phone] ? ?ANTERIOR APPROACH TOTAL HIP REPLACEMENT POSTOPERATIVE DIRECTIONS ? ? ? ? ?Hip Rehabilitation, Guidelines Following Surgery  ?The results of a hip operation are greatly improved after range of motion and muscle strengthening exercises. Follow all safety measures which are given to protect your hip. If any of these exercises cause increased pain or swelling in your joint, decrease the amount until you are comfortable again. Then slowly increase the exercises. Call your caregiver if you have problems or questions.  ? ?BLOOD CLOT PREVENTION ?Take a 325 mg Aspirin two times a day for three weeks following surgery. Then take an 81 mg Aspirin once a day for three weeks. Then discontinue Aspirin. ?You may resume your vitamins/supplements upon discharge from the hospital. ?Do not take any NSAIDs (Advil, Aleve, Ibuprofen, Meloxicam, etc.) until you have discontinued the 325 mg Aspirin.  ? ?HOME CARE INSTRUCTIONS  ?Remove items at home which could result in a fall. This includes throw rugs or furniture in walking pathways.  ?ICE to the affected hip as frequently as 20-30 minutes an hour and then as needed for pain and swelling. Continue to use ice on the hip for pain and swelling from surgery. You may notice swelling that will progress down to the foot and ankle. This is normal after surgery. Elevate the leg when you are not up walking on it.   ?Continue to use the breathing machine which will help keep your temperature down.  It is common for your temperature to cycle up and down following surgery, especially at night when you are not up moving around and exerting yourself.  The breathing machine keeps your lungs expanded and your temperature down. ? ?DIET ?You may resume your previous home diet once your are discharged from the hospital. ? ?DRESSING / WOUND CARE / SHOWERING ?You have  an adhesive waterproof bandage over the incision. Leave this in place until your first follow-up appointment. Once you remove this you will not need to place another bandage.  ?You may begin showering 3 days following surgery, but do not submerge the incision under water. ? ?ACTIVITY ?For the first 3-5 days, it is important to rest and keep the operative leg elevated. You should, as a general rule, rest for 50 minutes and walk/stretch for 10 minutes per hour. After 5 days, you may slowly increase activity as tolerated.  ?Perform the exercises you were provided twice a day for about 15-20 minutes each session. Begin these 2 days following surgery. ?Walk with your walker as instructed. Use the walker until you are comfortable transitioning to a cane. Walk with the cane in the opposite hand of the operative leg. You may discontinue the cane once you are comfortable and walking steadily. ?Avoid periods of inactivity such as sitting longer than an hour when not asleep. This helps prevent blood clots.  ?Do not drive a car for 6 weeks or until released by your surgeon.  ?Do not drive while taking narcotics. ? ?TED HOSE STOCKINGS ?Wear the elastic stockings on both legs for three weeks following surgery during the day. You may remove them at night while sleeping. ? ?WEIGHT BEARING ?Weight bearing as tolerated with assist device (walker, cane, etc) as directed, use it as long as suggested by your surgeon or therapist, typically at least 4-6 weeks. ? ?POSTOPERATIVE CONSTIPATION PROTOCOL ?Constipation - defined medically as fewer than three stools per week and severe constipation  as less than one stool per week. ? ?One of the most common issues patients have following surgery is constipation.  Even if you have a regular bowel pattern at home, your normal regimen is likely to be disrupted due to multiple reasons following surgery.  Combination of anesthesia, postoperative narcotics, change in appetite and fluid intake all can  affect your bowels.  In order to avoid complications following surgery, here are some recommendations in order to help you during your recovery period. ? ?Colace (docusate) - Pick up an over-the-counter form of Colace or another stool softener and take twice a day as long as you are requiring postoperative pain medications.  Take with a full glass of water daily.  If you experience loose stools or diarrhea, hold the colace until you stool forms back up.  If your symptoms do not get better within 1 week or if they get worse, check with your doctor. ?Dulcolax (bisacodyl) - Pick up over-the-counter and take as directed by the product packaging as needed to assist with the movement of your bowels.  Take with a full glass of water.  Use this product as needed if not relieved by Colace only.  ?MiraLax (polyethylene glycol) - Pick up over-the-counter to have on hand.  MiraLax is a solution that will increase the amount of water in your bowels to assist with bowel movements.  Take as directed and can mix with a glass of water, juice, soda, coffee, or tea.  Take if you go more than two days without a movement.Do not use MiraLax more than once per day. Call your doctor if you are still constipated or irregular after using this medication for 7 days in a row. ? ?If you continue to have problems with postoperative constipation, please contact the office for further assistance and recommendations.  If you experience "the worst abdominal pain ever" or develop nausea or vomiting, please contact the office immediatly for further recommendations for treatment. ? ?ITCHING ? If you experience itching with your medications, try taking only a single pain pill, or even half a pain pill at a time.  You can also use Benadryl over the counter for itching or also to help with sleep.  ? ?MEDICATIONS ?See your medication summary on the ?After Visit Summary? that the nursing staff will review with you prior to discharge.  You may have some home  medications which will be placed on hold until you complete the course of blood thinner medication.  It is important for you to complete the blood thinner medication as prescribed by your surgeon.  Continue your approved medications as instructed at time of discharge. ? ?PRECAUTIONS ?If you experience chest pain or shortness of breath - call 911 immediately for transfer to the hospital emergency department.  ?If you develop a fever greater that 101 F, purulent drainage from wound, increased redness or drainage from wound, foul odor from the wound/dressing, or calf pain - CONTACT YOUR SURGEON.   ?                                                ?FOLLOW-UP APPOINTMENTS ?Make sure you keep all of your appointments after your operation with your surgeon and caregivers. You should call the office at the above phone number and make an appointment for approximately two weeks after the date of your surgery or on  the date instructed by your surgeon outlined in the "After Visit Summary". ? ?RANGE OF MOTION AND STRENGTHENING EXERCISES  ?These exercises are designed to help you keep full movement of your hip joint. Follow your caregiver's or physical therapist's instructions. Perform all exercises about fifteen times, three times per day or as directed. Exercise both hips, even if you have had only one joint replacement. These exercises can be done on a training (exercise) mat, on the floor, on a table or on a bed. Use whatever works the best and is most comfortable for you. Use music or television while you are exercising so that the exercises are a pleasant break in your day. This will make your life better with the exercises acting as a break in routine you can look forward to.  ?Lying on your back, slowly slide your foot toward your buttocks, raising your knee up off the floor. Then slowly slide your foot back down until your leg is straight again.  ?Lying on your back spread your legs as far apart as you can without causing  discomfort.  ?Lying on your side, raise your upper leg and foot straight up from the floor as far as is comfortable. Slowly lower the leg and repeat.  ?Lying on your back, tighten up the muscle in t

## 2021-05-24 NOTE — Addendum Note (Signed)
Addendum  created 05/24/21 1230 by West Pugh, CRNA  ? Flowsheet accepted  ?  ?

## 2021-05-24 NOTE — Transfer of Care (Signed)
Immediate Anesthesia Transfer of Care Note ? ?Patient: [name] ? ?Procedure(s) Performed: TOTAL HIP ARTHROPLASTY ANTERIOR APPROACH (Right: Hip) ? ?Patient Location: PACU ? ?Anesthesia Type:General ? ?Level of Consciousness: awake, alert  and oriented ? ?Airway & Oxygen Therapy: Patient Spontanous Breathing and Patient connected to face mask oxygen ? ?Post-op Assessment: Report given to RN, Post -op Vital signs reviewed and stable and Patient moving all extremities X 4 ? ?Post vital signs: Reviewed and stable ? ?Last Vitals:  ?Vitals Value Taken Time  ?BP 134/84 05/24/21 1105  ?Temp    ?Pulse 84 05/24/21 1106  ?Resp 11 05/24/21 1106  ?SpO2 100 % 05/24/21 1106  ?Vitals shown include unvalidated device data. ? ?Last Pain:  ?Vitals:  ? 05/24/21 0802  ?TempSrc: Oral  ?PainSc:   ?   ? ?Patients Stated Pain Goal: 5 (05/24/21 0743) ? ?Complications: No notable events documented. ?

## 2021-05-24 NOTE — Anesthesia Postprocedure Evaluation (Signed)
Anesthesia Post Note ? ?Patient: Brian Melton ? ?Procedure(s) Performed: TOTAL HIP ARTHROPLASTY ANTERIOR APPROACH (Right: Hip) ? ?  ? ?Patient location during evaluation: PACU ?Anesthesia Type: General ?Level of consciousness: awake and alert ?Pain management: pain level controlled ?Vital Signs Assessment: post-procedure vital signs reviewed and stable ?Respiratory status: spontaneous breathing, nonlabored ventilation, respiratory function stable and patient connected to nasal cannula oxygen ?Cardiovascular status: blood pressure returned to baseline and stable ?Postop Assessment: no apparent nausea or vomiting ?Anesthetic complications: no ? ? ?No notable events documented. ? ?Last Vitals:  ?Vitals:  ? 05/24/21 1145 05/24/21 1200  ?BP: 138/77 (!) 148/81  ?Pulse: 66 79  ?Resp: 16 17  ?Temp:    ?SpO2: 98% 100%  ?  ?Last Pain:  ?Vitals:  ? 05/24/21 1145  ?TempSrc:   ?PainSc: 7   ? ? ?  ?  ?  ?  ?  ?  ? ?Domanick Cuccia ? ? ? ? ?

## 2021-05-24 NOTE — Anesthesia Procedure Notes (Signed)
Procedure Name: Intubation ?Date/Time: 05/24/2021 9:43 AM ?Performed by: West Pugh, CRNA ?Pre-anesthesia Checklist: Patient identified, Emergency Drugs available, Suction available, Patient being monitored and Timeout performed ?Patient Re-evaluated:Patient Re-evaluated prior to induction ?Oxygen Delivery Method: Circle system utilized ?Preoxygenation: Pre-oxygenation with 100% oxygen ?Induction Type: IV induction ?Ventilation: Mask ventilation without difficulty ?Laryngoscope Size: Sabra Heck and 2 ?Grade View: Grade II ?Tube type: Oral ?Tube size: 7.5 mm ?Number of attempts: 1 ?Airway Equipment and Method: Stylet ?Placement Confirmation: ETT inserted through vocal cords under direct vision, positive ETCO2, CO2 detector and breath sounds checked- equal and bilateral ?Secured at: 22 cm ?Tube secured with: Tape ?Dental Injury: Teeth and Oropharynx as per pre-operative assessment  ? ? ? ? ?

## 2021-05-24 NOTE — Op Note (Signed)
?    OPERATIVE REPORT- TOTAL HIP ARTHROPLASTY ? ? ?PREOPERATIVE DIAGNOSIS: Osteoarthritis of the Right hip.  ? ?POSTOPERATIVE DIAGNOSIS: Osteoarthritis of the Right  hip.  ? ?PROCEDURE: Right total hip arthroplasty, anterior approach.  ? ?SURGEON: Gaynelle Arabian, MD  ? ?ASSISTANT: Molli Barrows, PA-C ? ?ANESTHESIA:  General ? ?ESTIMATED BLOOD LOSS:-250 mL   ? ?DRAINS: Hemovac x1.  ? ?COMPLICATIONS: None ?  ?CONDITION: PACU - hemodynamically stable.  ? ?BRIEF CLINICAL NOTE: Brian Melton is a 56 y.o. male who has advanced end-  ?stage arthritis of their Right  hip with progressively worsening pain and  ?dysfunction.The patient has failed nonoperative management and presents for  ?total hip arthroplasty.  ? ?PROCEDURE IN DETAIL: After successful administration of spinal  ?anesthetic, the traction boots for the [facility] bed were placed on both  ?feet and the patient was placed onto the [facility] bed, boots placed into the leg  ?holders. The Right hip was then isolated from the perineum with plastic  ?drapes and prepped and draped in the usual sterile fashion. ASIS and  ?greater trochanter were marked and a oblique incision was made, starting  ?at about 1 cm lateral and 2 cm distal to the ASIS and coursing towards  ?the anterior cortex of the femur. The skin was cut with a 10 blade  ?through subcutaneous tissue to the level of the fascia overlying the  ?tensor fascia lata muscle. The fascia was then incised in line with the  ?incision at the junction of the anterior third and posterior 2/3rd. The  ?muscle was teased off the fascia and then the interval between the TFL  ?and the rectus was developed. The Hohmann retractor was then placed at  ?the top of the femoral neck over the capsule. The vessels overlying the  ?capsule were cauterized and the fat on top of the capsule was removed.  ?A Hohmann retractor was then placed anterior underneath the rectus  ?femoris to give exposure to the entire anterior capsule. A T-shaped   ?capsulotomy was performed. The edges were tagged and the femoral head  ?was identified. ?      Osteophytes are removed off the superior acetabulum.  ?The femoral neck was then cut in situ with an oscillating saw. Traction  ?was then applied to the left lower extremity utilizing the [facility]  ?traction. The femoral head was then removed. Retractors were placed  ?around the acetabulum and then circumferential removal of the labrum was  ?performed. Osteophytes were also removed. Reaming starts at 47 mm to  ?medialize and  Increased in 2 mm increments to 51 mm. We reamed in  ?approximately 40 degrees of abduction, 20 degrees anteversion. A 52 mm  ?pinnacle acetabular shell was then impacted in anatomic position under  ?fluoroscopic guidance with excellent purchase. We did not need to place  ?any additional dome screws. A 32 mm neutral + 4 marathon liner was then  ?placed into the acetabular shell.  ?     The femoral lift was then placed along the lateral aspect of the femur  ?just distal to the vastus ridge. The leg was  externally rotated and capsule  ?was stripped off the inferior aspect of the femoral neck down to the  ?level of the lesser trochanter, this was done with electrocautery. The femur was lifted after this was performed. The  leg was then placed in an extended and adducted position essentially delivering the femur. We also removed the capsule superiorly and the piriformis from the piriformis  fossa to gain excellent exposure of the  ?proximal femur. Rongeur was used to remove some cancellous bone to get  ?into the lateral portion of the proximal femur for placement of the  ?initial starter reamer. The starter broaches was placed  the starter broach  and was shown to go down the center of the canal. Broaching  ?with the Actis system was then performed starting at size 0  coursing  ?Up to size 7. A size 7 had excellent torsional and rotational  ?and axial stability. The trial standard offset neck was then  placed  ?with a 32 + 1 trial head. The hip was then reduced. We confirmed that  ?the stem was in the canal both on AP and lateral x-rays. It also has excellent sizing. The hip was reduced with outstanding stability through full extension and full external rotation.. AP pelvis was taken and the leg lengths were measured and found to be equal. Hip was then dislocated again and the femoral head and neck removed. The  ?femoral broach was removed. Size 7 Actis stem with a standard offset  ?neck was then impacted into the femur following native anteversion. Has  ?excellent purchase in the canal. Excellent torsional and rotational and  ?axial stability. It is confirmed to be in the canal on AP and lateral  ?fluoroscopic views. The 32 + 1 ceramic head was placed and the hip  ?reduced with outstanding stability. Again AP pelvis was taken and it  ?confirmed that the leg lengths were equal. The wound was then copiously  ?irrigated with saline solution and the capsule reattached and repaired  ?with Ethibond suture. 30 ml of .25% Bupivicaine was  injected into the capsule and into the edge of the tensor fascia lata as well as subcutaneous tissue. The fascia overlying the tensor fascia lata was then closed with a running #1 V-Loc. Subcu was closed with interrupted 2-0 Vicryl and subcuticular running 4-0 Monocryl. Incision was cleaned  ?and dried. Steri-Strips and a bulky sterile dressing applied. The patient was awakened and transported to  ?recovery in stable condition.  ?      Please note that a surgical assistant was a medical necessity for this procedure to perform it in a safe and expeditious manner. Assistant was necessary to provide appropriate retraction of vital neurovascular structures and to prevent femoral fracture and allow for anatomic placement of the prosthesis. ? ?Gaynelle Arabian, M.D.  ? ? ?

## 2021-05-24 NOTE — Evaluation (Signed)
Physical Therapy Evaluation ?Patient Details ?Name: Brian Melton ?MRN: [id number] ?DOB: 11-06-1965 ?Today's Date: 05/24/2021 ? ?History of Present Illness ? Pt is a 56yo male presenting s/p R-THA, AA on 05/24/21. PMH: DM, OSA w/CPAP,  ?Clinical Impression ? Brian Melton is a 56 y.o. male POD 0 s/p procedure R-THA, AA. Patient reports independence with mobility at baseline. Patient is now limited by functional impairments (see PT problem list below) and requires min guard for transfers and gait with RW. Patient was able to ambulate 40 feet with RW and min guard assist. Patient instructed in exercise to facilitate ROM and circulation to manage edema; educated on proper incentive spirometry usage and demonstrated good technique. Patient will benefit from continued skilled PT interventions to address impairments and progress towards PLOF. Acute PT will follow to progress mobility and stair training in preparation for safe discharge home.   ?   ? ?Recommendations for follow up therapy are one component of a multi-disciplinary discharge planning process, led by the attending physician.  Recommendations may be updated based on patient status, additional functional criteria and insurance authorization. ? ?Follow Up Recommendations Follow physician's recommendations for discharge plan and follow up therapies ? ?  ?Assistance Recommended at Discharge PRN  ?Patient can return home with the following ? A little help with walking and/or transfers;A little help with bathing/dressing/bathroom;Assistance with cooking/housework;Assist for transportation;Help with stairs or ramp for entrance ? ?  ?Equipment Recommendations None recommended by PT  ?Recommendations for Other Services ?    ?  ?Functional Status Assessment Patient has had a recent decline in their functional status and demonstrates the ability to make significant improvements in function in a reasonable and predictable amount of time.  ? ?  ?Precautions /  Restrictions Precautions ?Precautions: None ?Restrictions ?Weight Bearing Restrictions: No ?Other Position/Activity Restrictions: WBAT  ? ?  ? ?Mobility ? Bed Mobility ?Overal bed mobility: Needs Assistance ?Bed Mobility: Supine to Sit ?  ?  ?Supine to sit: Supervision ?  ?  ?General bed mobility comments: Pt supervision for safety only, no physical assist provided ?  ? ?Transfers ?Overall transfer level: Needs assistance ?Equipment used: Rolling walker (2 wheels) ?Transfers: Sit to/from Stand ?Sit to Stand: Min guard ?  ?  ?  ?  ?  ?General transfer comment: Pt min guard for safety, no physical assist provided. Upon standing, pt completed R-L weightshifts and 1-2 reps marches to demonstrate weightbearing status. ?  ? ?Ambulation/Gait ?Ambulation/Gait assistance: Min guard, +2 safety/equipment ?Gait Distance (Feet): 40 Feet ?Assistive device: Rolling walker (2 wheels) ?Gait Pattern/deviations: Step-to pattern ?Gait velocity: decreased ?  ?  ?General Gait Details: Pt ambulated with RW and min gaurd assist +2 for recliner follow for safety only, no physical assist provided. Demonstrated step-to pattern with safe walker management. No overt LOB. ? ?Stairs ?  ?  ?  ?  ?  ? ?Wheelchair Mobility ?  ? ?Modified Rankin (Stroke Patients Only) ?  ? ?  ? ?Balance Overall balance assessment: Needs assistance ?Sitting-balance support: Feet supported, No upper extremity supported ?Sitting balance-Leahy Scale: Fair ?  ?  ?Standing balance support: During functional activity, Bilateral upper extremity supported ?Standing balance-Leahy Scale: Fair ?Standing balance comment: Pt used BUE on RW during ambulation, but during static stance was able to let go to allow for adjustment of walker posts without LOB. ?  ?  ?  ?  ?  ?  ?  ?  ?  ?  ?  ?   ? ? ? ?  Pertinent Vitals/Pain Pain Assessment ?Pain Assessment: 0-10 ?Pain Score: 3  ?Pain Location: R hip ?Pain Descriptors / Indicators: Operative site guarding, Discomfort ?Pain  Intervention(s): Limited activity within patient's tolerance, Monitored during session, Repositioned, Ice applied  ? ? ?Home Living Family/patient expects to be discharged to:: Private residence ?Living Arrangements: Spouse/significant other ?Available Help at Discharge: Family;Available 24 hours/day ?Type of Home: House ?Home Access: Stairs to enter ?Entrance Stairs-Rails: None ?Entrance Stairs-Number of Steps: 2 ?  ?Home Layout: One level ?Home Equipment: Shower seat - built Medical sales representative (2 wheels);Cane - single point ?   ?  ?Prior Function Prior Level of Function : Independent/Modified Independent ?  ?  ?  ?  ?  ?  ?Mobility Comments: IND ?ADLs Comments: mod I for increased time ?  ? ? ?Hand Dominance  ? Dominant Hand: Right ? ?  ?Extremity/Trunk Assessment  ? Upper Extremity Assessment ?Upper Extremity Assessment: Overall WFL for tasks assessed ?  ? ?Lower Extremity Assessment ?Lower Extremity Assessment: RLE deficits/detail;LLE deficits/detail ?RLE Deficits / Details: MMT ank pf/df 5/5 ?RLE Sensation: WNL ?LLE Deficits / Details: MMT ank pf/df 5/5 ?LLE Sensation: WNL ?  ? ?Cervical / Trunk Assessment ?Cervical / Trunk Assessment: Normal  ?Communication  ? Communication: No difficulties  ?Cognition Arousal/Alertness: Awake/alert ?Behavior During Therapy: [facility] for tasks assessed/performed ?Overall Cognitive Status: Within Functional Limits for tasks assessed ?  ?  ?  ?  ?  ?  ?  ?  ?  ?  ?  ?  ?  ?  ?  ?  ?  ?  ?  ? ?  ?General Comments   ? ?  ?Exercises Total Joint Exercises ?Ankle Circles/Pumps: AROM, 20 reps, Both  ? ?Assessment/Plan  ?  ?PT Assessment Patient needs continued PT services  ?PT Problem List Decreased strength;Decreased range of motion;Decreased activity tolerance;Decreased balance;Decreased mobility;Decreased coordination;Pain ? ?   ?  ?PT Treatment Interventions DME instruction;Gait training;Stair training;Functional mobility training;Therapeutic activities;Therapeutic exercise;Balance  training;Neuromuscular re-education;Patient/family education   ? ?PT Goals (Current goals can be found in the Care Plan section)  ?Acute Rehab PT Goals ?Patient Stated Goal: walking without pain ?PT Goal Formulation: With patient ?Time For Goal Achievement: 05/31/21 ?Potential to Achieve Goals: Good ? ?  ?Frequency 7X/week ?  ? ? ?Co-evaluation   ?  ?  ?  ?  ? ? ?  ?AM-PAC PT "6 Clicks" Mobility  ?Outcome Measure Help needed turning from your back to your side while in a flat bed without using bedrails?: None ?Help needed moving from lying on your back to sitting on the side of a flat bed without using bedrails?: None ?Help needed moving to and from a bed to a chair (including a wheelchair)?: A Little ?Help needed standing up from a chair using your arms (e.g., wheelchair or bedside chair)?: A Little ?Help needed to walk in hospital room?: A Little ?Help needed climbing 3-5 steps with a railing? : A Little ?6 Click Score: 20 ? ?  ?End of Session Equipment Utilized During Treatment: Gait belt ?Activity Tolerance: Patient tolerated treatment well ?Patient left: in chair;with call bell/phone within reach;with chair alarm set;with family/visitor present;with SCD's reapplied ?Nurse Communication: Mobility status ?PT Visit Diagnosis: Difficulty in walking, not elsewhere classified (R26.2) ?  ? ?Time: 1497-0263 ?PT Time Calculation (min) (ACUTE ONLY): 28 min ? ? ?Charges:   PT Evaluation ?$PT Eval Low Complexity: 1 Low ?PT Treatments ?$Gait Training: 8-22 mins ?  ?   ? ? ?Coolidge Breeze, PT, DPT ?  [facility] Department ?Office: [phone] ?Pager: [phone] ? ?Coolidge Breeze ?05/24/2021, 3:14 PM ? ?

## 2021-05-24 NOTE — Addendum Note (Signed)
Addendum  created 05/24/21 1233 by West Pugh, CRNA  ? Charge Capture section accepted, Visit diagnoses modified  ?  ?

## 2021-05-24 NOTE — Interval H&P Note (Signed)
History and Physical Interval Note: ? ?05/24/2021 ?7:22 AM ? ?Brian Melton  has presented today for surgery, with the diagnosis of Right hip osteoarthritis.  The various methods of treatment have been discussed with the patient and family. After consideration of risks, benefits and other options for treatment, the patient has consented to  Procedure(s): ?TOTAL HIP ARTHROPLASTY ANTERIOR APPROACH (Right) as a surgical intervention.  The patient's history has been reviewed, patient examined, no change in status, stable for surgery.  I have reviewed the patient's chart and labs.  Questions were answered to the patient's satisfaction.   ? ? ?[name] ? ? ?

## 2021-05-25 ENCOUNTER — Encounter (HOSPITAL_COMMUNITY): Payer: Self-pay | Admitting: Orthopedic Surgery

## 2021-05-25 DIAGNOSIS — F17220 Nicotine dependence, chewing tobacco, uncomplicated: Secondary | ICD-10-CM | POA: Diagnosis not present

## 2021-05-25 DIAGNOSIS — E119 Type 2 diabetes mellitus without complications: Secondary | ICD-10-CM | POA: Diagnosis not present

## 2021-05-25 DIAGNOSIS — M1611 Unilateral primary osteoarthritis, right hip: Secondary | ICD-10-CM | POA: Diagnosis not present

## 2021-05-25 DIAGNOSIS — Z85828 Personal history of other malignant neoplasm of skin: Secondary | ICD-10-CM | POA: Diagnosis not present

## 2021-05-25 DIAGNOSIS — Z7984 Long term (current) use of oral hypoglycemic drugs: Secondary | ICD-10-CM | POA: Diagnosis not present

## 2021-05-25 LAB — BASIC METABOLIC PANEL
Anion gap: 7 (ref 5–15)
BUN: 15 mg/dL (ref 6–20)
CO2: 26 mmol/L (ref 22–32)
Calcium: 8.7 mg/dL — ABNORMAL LOW (ref 8.9–10.3)
Chloride: 102 mmol/L (ref 98–111)
Creatinine, Ser: 0.55 mg/dL — ABNORMAL LOW (ref 0.61–1.24)
GFR, Estimated: >60 mL/min (ref >60)
Glucose, Bld: 142 mg/dL — ABNORMAL HIGH (ref 70–99)
Potassium: 4 mmol/L (ref 3.5–5.1)
Sodium: 135 mmol/L (ref 135–145)

## 2021-05-25 LAB — CBC
HCT: 41.9 % (ref 39.0–52.0)
Hemoglobin: 14.2 g/dL (ref 13.0–17.0)
MCH: 30.5 pg (ref 26.0–34.0)
MCHC: 33.9 g/dL (ref 30.0–36.0)
MCV: 89.9 fL (ref 80.0–100.0)
Platelets: 195 10*3/uL (ref 150–400)
RBC: 4.66 MIL/uL (ref 4.22–5.81)
RDW: 13.2 % (ref 11.5–15.5)
WBC: 13.7 10*3/uL — ABNORMAL HIGH (ref 4.0–10.5)
nRBC: 0 % (ref 0.0–0.2)

## 2021-05-25 LAB — GLUCOSE, CAPILLARY: Glucose-Capillary: 182 mg/dL — ABNORMAL HIGH (ref 70–99)

## 2021-05-25 MED ORDER — TRAMADOL HCL 50 MG PO TABS: 50.0000 mg | ORAL_TABLET | Freq: Four times a day (QID) | ORAL | 0 refills | Status: DC | PRN

## 2021-05-25 MED ORDER — METHOCARBAMOL 500 MG PO TABS: 500.0000 mg | ORAL_TABLET | Freq: Four times a day (QID) | ORAL | 0 refills | Status: DC | PRN

## 2021-05-25 MED ORDER — HYDROCODONE-ACETAMINOPHEN 10-325 MG PO TABS: 0.5000 | ORAL_TABLET | Freq: Four times a day (QID) | ORAL | 0 refills | Status: DC | PRN

## 2021-05-25 MED ORDER — HYDROCODONE-ACETAMINOPHEN 5-325 MG PO TABS: 1.0000 | ORAL_TABLET | Freq: Four times a day (QID) | ORAL | 0 refills | Status: DC | PRN

## 2021-05-25 MED ORDER — ASPIRIN 325 MG PO TBEC
325.0000 mg | DELAYED_RELEASE_TABLET | Freq: Two times a day (BID) | ORAL | 0 refills | Status: AC
Start: 2021-05-25 — End: 2021-06-14

## 2021-05-25 NOTE — Discharge Summary (Signed)
Patient ID: ?Janice Coffin ?MRN: [id number] ?DOB/AGE: Feb 16, 1965 56 y.o. ? ?Admit date: 05/24/2021 ?Discharge date: 05/25/2021 ? ?Admission Diagnoses:  ?Principal Problem: ?  OA (osteoarthritis) of hip ?Active Problems: ?  Osteoarthritis of right hip ? ? ?Discharge Diagnoses:  ?Same ? ?Past Medical History:  ?Diagnosis Date  ? Arthritis   ? Cancer [facility])   ? skin cancer  ? Diabetes mellitus without complication ([employer])   ? Heart murmur   ? RBBB  ? History of kidney stones   ? Pneumonia   ? Sleep apnea   ? uses cpap  ? Umbilical hernia   ? ? ?Surgeries: Procedure(s): ?TOTAL HIP ARTHROPLASTY ANTERIOR APPROACH on 05/24/2021 ?  ?Consultants:  ? ?Discharged Condition: Improved ? ?Hospital Course: KEEVEN MATTY is an 56 y.o. male who was admitted 05/24/2021 for operative treatment ofOA (osteoarthritis) of hip. Patient has severe unremitting pain that affects sleep, daily activities, and work/hobbies. After pre-op clearance the patient was taken to the operating room on 05/24/2021 and underwent  Procedure(s): ?TOTAL HIP ARTHROPLASTY ANTERIOR APPROACH.   ? ?Patient was given perioperative antibiotics:  ?Anti-infectives (From admission, onward)  ? ? Start     Dose/Rate Route Frequency Ordered Stop  ? 05/24/21 1600  ceFAZolin (ANCEF) IVPB 2g/100 mL premix       ? 2 g ?200 mL/hr over 30 Minutes Intravenous Every 6 hours 05/24/21 1055 05/24/21 2247  ? 05/24/21 0730  ceFAZolin (ANCEF) IVPB 2g/100 mL premix       ? 2 g ?200 mL/hr over 30 Minutes Intravenous On call to O.R. 05/24/21 0721 05/24/21 1013  ? ?  ?  ? ?Patient was given sequential compression devices, early ambulation, and chemoprophylaxis to prevent DVT. ? ?Patient benefited maximally from hospital stay and there were no complications.   ? ?Recent vital signs: Patient Vitals for the past 24 hrs: ? BP Temp Temp src Pulse Resp SpO2  ?05/25/21 0930 134/76 99.3 ?F (37.4 ?C) Oral 86 15 98 %  ?05/25/21 0604 135/69 98.7 ?F (37.1 ?C) Oral 77 18 95 %  ?05/25/21 0228 135/75 98.1 ?F  (36.7 ?C) Oral 72 18 97 %  ?05/24/21 2202 133/68 98.4 ?F (36.9 ?C) Oral 78 18 96 %  ?05/24/21 1456 114/71 97.7 ?F (36.5 ?C) -- 66 20 95 %  ?05/24/21 1244 127/76 98 ?F (36.7 ?C) Oral 72 20 98 %  ?05/24/21 1230 140/74 97.8 ?F (36.6 ?C) -- 67 16 100 %  ?05/24/21 1215 136/73 -- -- 67 17 100 %  ?  ? ?Recent laboratory studies:  ?Recent Labs  ?  05/25/21 ?0326  ?WBC 13.7*  ?HGB 14.2  ?HCT 41.9  ?PLT 195  ?NA 135  ?K 4.0  ?CL 102  ?CO2 26  ?BUN 15  ?CREATININE 0.55*  ?GLUCOSE 142*  ?CALCIUM 8.7*  ? ? ? ?Discharge Medications:   ?Allergies as of 05/25/2021   ? ?   Reactions  ? Penicillins   ? UNSPECIFIED CHILDHOOD REACTION  ?Has patient had a PCN reaction causing immediate rash, facial/tongue/throat swelling, SOB or lightheadedness with hypotension: No ?Has patient had a PCN reaction causing severe rash involving mucus membranes or skin necrosis: No ?Has patient had a PCN reaction that required hospitalization No ?Has patient had a PCN reaction occurring within the last 10 years: No ?Tolerated Cephalosporin Date: 05/25/21.  ? ?  ? ?  ?Medication List  ?  ? ?STOP taking these medications   ? ?cyclobenzaprine 10 MG tablet ?Commonly known as: FLEXERIL ?  ?ibuprofen  200 MG tablet ?Commonly known as: ADVIL ?  ?meloxicam 7.5 MG tablet ?Commonly known as: MOBIC ?  ? ?  ? ?TAKE these medications   ? ?aspirin 325 MG EC tablet ?Take 1 tablet (325 mg total) by mouth 2 (two) times daily for 20 days. Then take one 81 mg aspirin once a day for three weeks. Then discontinue aspirin. ?  ?atorvastatin 10 MG tablet ?Commonly known as: LIPITOR ?Take 10 mg by mouth at bedtime. ?Notes to patient: Resume home regimen ?  ?dapagliflozin propanediol 10 MG Tabs tablet ?Commonly known as: FARXIGA ?Take 10 mg by mouth daily. ?  ?glipiZIDE 5 MG 24 hr tablet ?Commonly known as: GLUCOTROL XL ?Take 10 mg by mouth in the morning and at bedtime. ?  ?HYDROcodone-acetaminophen 5-325 MG tablet ?Commonly known as: NORCO/VICODIN ?Take 1-2 tablets by mouth every 6  (six) hours as needed for severe pain. ?Notes to patient: Last dose given 05/04 09:32am ?  ?HYDROcodone-acetaminophen 10-325 MG tablet ?Commonly known as: NORCO ?Take 0.5-1 tablets by mouth every 6 (six) hours as needed for severe pain. ?  ?losartan 100 MG tablet ?Commonly known as: COZAAR ?Take 100 mg by mouth daily. ?  ?metFORMIN 500 MG 24 hr tablet ?Commonly known as: GLUCOPHAGE-XR ?Take 1,000 mg by mouth 2 (two) times daily. ?Notes to patient: Resume home regimen ?  ?methocarbamol 500 MG tablet ?Commonly known as: ROBAXIN ?Take 1 tablet (500 mg total) by mouth every 6 (six) hours as needed for muscle spasms. ?Notes to patient: Last dose given 05/04 07:15am ?  ?metoprolol succinate 25 MG 24 hr tablet ?Commonly known as: TOPROL-XL ?Take 1 tablet (25 mg total) by mouth daily. Must call [phone] to schedule an appt to continue refills. Thank you. 1st attempt ?  ?pioglitazone 30 MG tablet ?Commonly known as: ACTOS ?Take 30 mg by mouth daily. ?Notes to patient: Resume home regimen ?  ?traMADol 50 MG tablet ?Commonly known as: ULTRAM ?Take 1-2 tablets (50-100 mg total) by mouth every 6 (six) hours as needed for moderate pain. ?Notes to patient: Last dose given 05/04 04:21am ?  ? ?  ? ?  ?  ? ? ?  ?Discharge Care Instructions  ?(From admission, onward)  ?  ? ? ?  ? ?  Start     Ordered  ? 05/25/21 0000  Weight bearing as tolerated       ? 05/25/21 0740  ? 05/25/21 0000  Change dressing       ?Comments: You have an adhesive waterproof bandage over the incision. Leave this in place until your first follow-up appointment. Once you remove this you will not need to place another bandage.  ? 05/25/21 0740  ? ?  ?  ? ?  ? ? ?Diagnostic Studies: DG Pelvis Portable ? ?Result Date: 05/24/2021 ?CLINICAL DATA:  Status post right hip replacement. EXAM: PORTABLE PELVIS 1-2 VIEWS COMPARISON:  None Available. FINDINGS: Right femoral and acetabular components are well situated. Expected postoperative changes are noted in the  surrounding soft tissues. IMPRESSION: Status post right total hip arthroplasty. Electronically Signed   By: Marijo Conception M.D.   On: 05/24/2021 12:29  ? ?DG C-Arm 1-60 Min-No Report ? ?Result Date: 05/24/2021 ?Fluoroscopy was utilized by the requesting physician.  No radiographic interpretation.  ? ?DG HIP UNILAT WITH PELVIS 1V RIGHT ? ?Result Date: 05/24/2021 ?CLINICAL DATA:  Right hip arthroplasty EXAM: DG HIP (WITH OR WITHOUT PELVIS) 1V RIGHT COMPARISON:  09/23/2017 FINDINGS: 4 C-arm fluoroscopic images were obtained intraoperatively  and submitted for post operative interpretation. Four intraoperative images during right total hip arthroplasty. 9 seconds fluoroscopy time utilized. Dose: 2.0 mGy. Please see the performing provider's procedural report for further detail. IMPRESSION: As above. Electronically Signed   By: Davina Poke D.O.   On: 05/24/2021 10:57   ? ?Disposition: Discharge disposition: 01-Home or Self Care ? ? ? ? ? ? ?Discharge Instructions   ? ? Call MD / Call 911   Complete by: As directed ?  ? If you experience chest pain or shortness of breath, CALL 911 and be transported to the hospital emergency room.  If you develope a fever above 101 F, pus (white drainage) or increased drainage or redness at the wound, or calf pain, call your surgeon's office.  ? Change dressing   Complete by: As directed ?  ? You have an adhesive waterproof bandage over the incision. Leave this in place until your first follow-up appointment. Once you remove this you will not need to place another bandage.  ? Constipation Prevention   Complete by: As directed ?  ? Drink plenty of fluids.  Prune juice may be helpful.  You may use a stool softener, such as Colace (over the counter) 100 mg twice a day.  Use MiraLax (over the counter) for constipation as needed.  ? Diet - low sodium heart healthy   Complete by: As directed ?  ? Do not sit on low chairs, stoools or toilet seats, as it may be difficult to get up from low  surfaces   Complete by: As directed ?  ? Driving restrictions   Complete by: As directed ?  ? No driving for two weeks  ? Post-operative opioid taper instructions:   Complete by: As directed ?  ? POST-OPERATIVE OPIOI

## 2021-05-25 NOTE — Progress Notes (Signed)
Physical Therapy Treatment ?Patient Details ?Name: Brian Melton ?MRN: [id number] ?DOB: [birth year] ?Today's Date: 05/25/2021 ? ? ?History of Present Illness Pt is a 56yo male presenting s/p R-THA, AA on 05/24/21. PMH: DM, OSA w/CPAP, ? ?  ?PT Comments  ? ? Patient making good progress with mobility min cues with supervision provided for transfers and gait with RW. Pt demonstrated good recall for technique for transfers and pt's son present to provide guarding/assist for gait and stair mobility. EOS reviewed HEP and addressed questions for safe discharge home. Will continue to progress during acute stay. Pt is mobilizing at safe level to return home with assist from family.  ? ?  ?Recommendations for follow up therapy are one component of a multi-disciplinary discharge planning process, led by the attending physician.  Recommendations may be updated based on patient status, additional functional criteria and insurance authorization. ? ?Follow Up Recommendations ? Follow physician's recommendations for discharge plan and follow up therapies ?  ?  ?Assistance Recommended at Discharge PRN  ?Patient can return home with the following A little help with walking and/or transfers;A little help with bathing/dressing/bathroom;Assistance with cooking/housework;Assist for transportation;Help with stairs or ramp for entrance ?  ?Equipment Recommendations ? None recommended by PT  ?  ?Recommendations for Other Services   ? ? ?  ?Precautions / Restrictions Precautions ?Precautions: None ?Restrictions ?Weight Bearing Restrictions: No ?RLE Weight Bearing: Weight bearing as tolerated ?Other Position/Activity Restrictions: WBAT  ?  ? ?Mobility ? Bed Mobility ?Overal bed mobility: Needs Assistance ?Bed Mobility: Supine to Sit ?  ?  ?Supine to sit: Supervision ?  ?  ?General bed mobility comments: Pt supervision for safety only, no physical assist provided ?  ? ?Transfers ?Overall transfer level: Needs assistance ?Equipment used: Rolling  walker (2 wheels) ?Transfers: Sit to/from Stand ?Sit to Stand: Supervision ?  ?  ?  ?  ?  ?General transfer comment: sueprvision for safety. min cue to use bil UE for power up and pt steady with rise. ?  ? ?Ambulation/Gait ?Ambulation/Gait assistance: Supervision ?Gait Distance (Feet): 170 Feet ?Assistive device: Rolling walker (2 wheels) ?Gait Pattern/deviations: Step-through pattern, Decreased weight shift to right ?Gait velocity: decreased ?  ?  ?General Gait Details: pt progressed to step through pattern, no LOB noted. pt's son provided safe gaurding with supervision/cues from therapist. ? ? ?Stairs ?Stairs: Yes ?Stairs assistance: Min assist, Supervision ?Stair Management: No rails, Step to pattern, Backwards, With walker ?Number of Stairs: 2 ?General stair comments: cues for step pattern and management of RW "up with good, down with bad". pt's son provided safe guarding/assist for RW and pt. supervision and cues from therapist. ? ? ?Wheelchair Mobility ?  ? ?Modified Rankin (Stroke Patients Only) ?  ? ? ?  ?Balance Overall balance assessment: Needs assistance ?Sitting-balance support: Feet supported, No upper extremity supported ?Sitting balance-Leahy Scale: Fair ?  ?  ?Standing balance support: During functional activity, Bilateral upper extremity supported ?Standing balance-Leahy Scale: Fair ?Standing balance comment: Pt used BUE on RW during ambulation, but during static stance was able to let go to allow for adjustment of walker posts without LOB. ?  ?  ?  ?  ?  ?  ?  ?  ?  ?  ?  ?  ? ?  ?Cognition Arousal/Alertness: Awake/alert ?Behavior During Therapy: [facility] for tasks assessed/performed ?Overall Cognitive Status: Within Functional Limits for tasks assessed ?  ?  ?  ?  ?  ?  ?  ?  ?  ?  ?  ?  ?  ?  ?  ?  ?  ?  ?  ? ?  ?  Exercises Total Joint Exercises ?Ankle Circles/Pumps: AROM, 20 reps, Both ?Quad Sets: AROM, Right, 5 reps ?Short Arc Quad: AROM, Right, 5 reps ?Heel Slides: AROM, Right, 5 reps ?Hip  ABduction/ADduction: AROM, Right, 5 reps ?Knee Flexion: AROM, Right, 5 reps, Standing ? ?  ?General Comments   ?  ?  ? ?Pertinent Vitals/Pain Pain Assessment ?Pain Assessment: 0-10 ?Pain Score: 3  ?Pain Location: R hip ?Pain Descriptors / Indicators: Operative site guarding, Discomfort ?Pain Intervention(s): Limited activity within patient's tolerance, Monitored during session, Repositioned  ? ? ?Home Living   ?  ?  ?  ?  ?  ?  ?  ?  ?  ?   ?  ?Prior Function    ?  ?  ?   ? ?PT Goals (current goals can now be found in the care plan section) Acute Rehab PT Goals ?Patient Stated Goal: walking without pain ?PT Goal Formulation: With patient ?Time For Goal Achievement: 05/31/21 ?Potential to Achieve Goals: Good ?Progress towards PT goals: Progressing toward goals ? ?  ?Frequency ? ? ? 7X/week ? ? ? ?  ?PT Plan Current plan remains appropriate  ? ? ?Co-evaluation   ?  ?  ?  ?  ? ?  ?AM-PAC PT "6 Clicks" Mobility   ?Outcome Measure ? Help needed turning from your back to your side while in a flat bed without using bedrails?: None ?Help needed moving from lying on your back to sitting on the side of a flat bed without using bedrails?: None ?Help needed moving to and from a bed to a chair (including a wheelchair)?: A Little ?Help needed standing up from a chair using your arms (e.g., wheelchair or bedside chair)?: A Little ?Help needed to walk in hospital room?: A Little ?Help needed climbing 3-5 steps with a railing? : A Little ?6 Click Score: 20 ? ?  ?End of Session Equipment Utilized During Treatment: Gait belt ?Activity Tolerance: Patient tolerated treatment well ?Patient left: in chair;with call bell/phone within reach;with chair alarm set;with family/visitor present;with SCD's reapplied ?Nurse Communication: Mobility status ?PT Visit Diagnosis: Difficulty in walking, not elsewhere classified (R26.2) ?  ? ? ?Time: 9937-1696 ?PT Time Calculation (min) (ACUTE ONLY): 34 min ? ?Charges:  $Gait Training: 8-22  mins ?$Therapeutic Exercise: 8-22 mins          ?          ? ?Gwynneth Albright PT, DPT ?Acute Rehabilitation Services ?Office [phone] ?Pager [phone]  ? ? ?Jacques Navy ?05/25/2021, 10:58 AM ? ?

## 2021-05-25 NOTE — Progress Notes (Signed)
Provided discharge education/instructions, all questions and concerns addressed. Pt not in acute distress, discharged home with belongings accompanied by family. 

## 2021-05-25 NOTE — Progress Notes (Signed)
? ?  Subjective: ?1 Day Post-Op Procedure(s) (LRB): ?TOTAL HIP ARTHROPLASTY ANTERIOR APPROACH (Right) ?Patient reports pain as mild.   ?Patient seen in rounds by Dr. Wynelle Link. ?Patient is well, and has had no acute complaints or problems. Denies chest pain or SOB. No issues overnight. Voiding without difficulty.  ?We will continue therapy today, ambulated 40' yesterday.  ? ?Objective: ?Vital signs in last 24 hours: ?Temp:  [97.7 ?F (36.5 ?C)-98.7 ?F (37.1 ?C)] 98.7 ?F (37.1 ?C) (05/04 0604) ?Pulse Rate:  [66-85] 77 (05/04 0604) ?Resp:  [11-20] 18 (05/04 0604) ?BP: (114-148)/(68-86) 135/69 (05/04 0604) ?SpO2:  [95 %-100 %] 95 % (05/04 0604) ?Weight:  [107.3 kg] 107.3 kg (05/03 0743) ? ?Intake/Output from previous day: ? ?Intake/Output Summary (Last 24 hours) at 05/25/2021 0736 ?Last data filed at 05/25/2021 0720 ?Gross per 24 hour  ?Intake 3055.23 ml  ?Output 900 ml  ?Net 2155.23 ml  ?  ? ?Intake/Output this shift: ?Total I/O ?In: 100 [I.V.:100] ?Out: -  ? ?Labs: ?Recent Labs  ?  05/25/21 ?0326  ?HGB 14.2  ? ?Recent Labs  ?  05/25/21 ?0326  ?WBC 13.7*  ?RBC 4.66  ?HCT 41.9  ?PLT 195  ? ?Recent Labs  ?  05/25/21 ?0326  ?NA 135  ?K 4.0  ?CL 102  ?CO2 26  ?BUN 15  ?CREATININE 0.55*  ?GLUCOSE 142*  ?CALCIUM 8.7*  ? ?No results for input(s): LABPT, INR in the last 72 hours. ? ?Exam: ?General - Patient is Alert and Oriented ?Extremity - Neurologically intact ?Neurovascular intact ?Sensation intact distally ?Dorsiflexion/Plantar flexion intact ?Dressing - dressing C/D/I ?Motor Function - intact, moving foot and toes well on exam.  ? ?Past Medical History:  ?Diagnosis Date  ? Arthritis   ? Cancer [facility])   ? skin cancer  ? Diabetes mellitus without complication ([place])   ? Heart murmur   ? RBBB  ? History of kidney stones   ? Pneumonia   ? Sleep apnea   ? uses cpap  ? Umbilical hernia   ? ? ?Assessment/Plan: ?1 Day Post-Op Procedure(s) (LRB): ?TOTAL HIP ARTHROPLASTY ANTERIOR APPROACH (Right) ?Principal Problem: ?  OA (osteoarthritis)  of hip ?Active Problems: ?  Osteoarthritis of right hip ? ?Estimated body mass index is 32.99 kg/m? as calculated from the following: ?  Height as of this encounter: '5\' 11"'$  (1.803 m). ?  Weight as of this encounter: 107.3 kg. ?Advance diet ?Up with therapy ?D/C IV fluids ? ?DVT Prophylaxis - Aspirin ?Weight bearing as tolerated. ?Continue therapy. ? ?Plan is to go Home after hospital stay. ?Plan for discharge with HEP this AM if progresses with therapy and meeting goals. ?Follow-up in the office in 2 weeks. ? ?The PDMP database was reviewed today prior to any opioid medications being prescribed to this patient. ? ?Theresa Duty, PA-C ?Orthopedic Surgery ?[phone] ?05/25/2021, 7:36 AM ? ?

## 2021-05-25 NOTE — TOC Transition Note (Signed)
Transition of Care (TOC) - CM/SW Discharge Note ? ? ?Patient Details  ?Name: Brian Melton ?MRN: [id number] ?Date of Birth: [birth year] ? ?Transition of Care (TOC) CM/SW Contact:  ?Britten Seyfried, LCSW ?Phone Number: ?05/25/2021, 11:28 AM ? ? ?Clinical Narrative:    ? ?Met briefly with pt and confirming he has needed DME at home.  Plan for HEP.  No TOC needs. ? ?Final next level of care: Home/Self Care ?Barriers to Discharge: No Barriers Identified ? ? ?Patient Goals and CMS Choice ?Patient states their goals for this hospitalization and ongoing recovery are:: return home ?  ?  ? ?Discharge Placement ?  ?           ?  ?  ?  ?  ? ?Discharge Plan and Services ?  ?  ?           ?DME Arranged: N/A ?DME Agency: NA ?  ?  ?  ?  ?  ?  ?  ?  ? ?Social Determinants of Health (SDOH) Interventions ?  ? ? ?Readmission Risk Interventions ?   ? View : No data to display.  ?  ?  ?  ? ? ? ? ? ?

## 2021-05-25 NOTE — Plan of Care (Signed)

## 2021-06-27 DIAGNOSIS — Z5189 Encounter for other specified aftercare: Secondary | ICD-10-CM | POA: Diagnosis not present

## 2021-08-18 DIAGNOSIS — H2513 Age-related nuclear cataract, bilateral: Secondary | ICD-10-CM | POA: Diagnosis not present

## 2021-08-18 DIAGNOSIS — H35033 Hypertensive retinopathy, bilateral: Secondary | ICD-10-CM | POA: Diagnosis not present

## 2021-08-18 DIAGNOSIS — H5203 Hypermetropia, bilateral: Secondary | ICD-10-CM | POA: Diagnosis not present

## 2021-08-18 DIAGNOSIS — H11153 Pinguecula, bilateral: Secondary | ICD-10-CM | POA: Diagnosis not present

## 2021-08-18 DIAGNOSIS — E119 Type 2 diabetes mellitus without complications: Secondary | ICD-10-CM | POA: Diagnosis not present

## 2021-08-25 DIAGNOSIS — L089 Local infection of the skin and subcutaneous tissue, unspecified: Secondary | ICD-10-CM | POA: Diagnosis not present

## 2021-08-25 DIAGNOSIS — J029 Acute pharyngitis, unspecified: Secondary | ICD-10-CM | POA: Diagnosis not present

## 2021-08-25 DIAGNOSIS — L509 Urticaria, unspecified: Secondary | ICD-10-CM | POA: Diagnosis not present

## 2021-08-25 DIAGNOSIS — Z6834 Body mass index (BMI) 34.0-34.9, adult: Secondary | ICD-10-CM | POA: Diagnosis not present

## 2021-09-22 DIAGNOSIS — I10 Essential (primary) hypertension: Secondary | ICD-10-CM | POA: Diagnosis not present

## 2021-09-22 DIAGNOSIS — E1169 Type 2 diabetes mellitus with other specified complication: Secondary | ICD-10-CM | POA: Diagnosis not present

## 2021-09-22 DIAGNOSIS — E7800 Pure hypercholesterolemia, unspecified: Secondary | ICD-10-CM | POA: Diagnosis not present

## 2022-03-23 DIAGNOSIS — I10 Essential (primary) hypertension: Secondary | ICD-10-CM | POA: Diagnosis not present

## 2022-03-23 DIAGNOSIS — E1169 Type 2 diabetes mellitus with other specified complication: Secondary | ICD-10-CM | POA: Diagnosis not present

## 2022-03-23 DIAGNOSIS — H35033 Hypertensive retinopathy, bilateral: Secondary | ICD-10-CM | POA: Diagnosis not present

## 2022-03-23 DIAGNOSIS — Z0000 Encounter for general adult medical examination without abnormal findings: Secondary | ICD-10-CM | POA: Diagnosis not present

## 2022-03-23 DIAGNOSIS — Z125 Encounter for screening for malignant neoplasm of prostate: Secondary | ICD-10-CM | POA: Diagnosis not present

## 2022-03-23 DIAGNOSIS — E7800 Pure hypercholesterolemia, unspecified: Secondary | ICD-10-CM | POA: Diagnosis not present

## 2022-08-24 DIAGNOSIS — H524 Presbyopia: Secondary | ICD-10-CM | POA: Diagnosis not present

## 2022-08-24 DIAGNOSIS — E119 Type 2 diabetes mellitus without complications: Secondary | ICD-10-CM | POA: Diagnosis not present

## 2022-08-24 DIAGNOSIS — H5203 Hypermetropia, bilateral: Secondary | ICD-10-CM | POA: Diagnosis not present

## 2022-08-24 DIAGNOSIS — H52221 Regular astigmatism, right eye: Secondary | ICD-10-CM | POA: Diagnosis not present

## 2022-08-24 DIAGNOSIS — H35033 Hypertensive retinopathy, bilateral: Secondary | ICD-10-CM | POA: Diagnosis not present

## 2022-10-05 DIAGNOSIS — E1169 Type 2 diabetes mellitus with other specified complication: Secondary | ICD-10-CM | POA: Diagnosis not present

## 2022-10-05 DIAGNOSIS — I10 Essential (primary) hypertension: Secondary | ICD-10-CM | POA: Diagnosis not present

## 2022-10-05 DIAGNOSIS — H35033 Hypertensive retinopathy, bilateral: Secondary | ICD-10-CM | POA: Diagnosis not present

## 2022-10-05 DIAGNOSIS — E7800 Pure hypercholesterolemia, unspecified: Secondary | ICD-10-CM | POA: Diagnosis not present

## 2023-02-10 ENCOUNTER — Emergency Department (HOSPITAL_COMMUNITY): Payer: BC Managed Care – PPO

## 2023-02-10 ENCOUNTER — Other Ambulatory Visit: Payer: Self-pay

## 2023-02-10 ENCOUNTER — Observation Stay (HOSPITAL_COMMUNITY)
Admission: EM | Admit: 2023-02-10 | Discharge: 2023-02-11 | Disposition: A | Discharge status: Home / Self Care | Payer: BC Managed Care – PPO | Attending: Family Medicine | Admitting: Family Medicine

## 2023-02-10 DIAGNOSIS — F17220 Nicotine dependence, chewing tobacco, uncomplicated: Secondary | ICD-10-CM | POA: Insufficient documentation

## 2023-02-10 DIAGNOSIS — R4182 Altered mental status, unspecified: Secondary | ICD-10-CM | POA: Diagnosis not present

## 2023-02-10 DIAGNOSIS — Z79899 Other long term (current) drug therapy: Secondary | ICD-10-CM | POA: Diagnosis not present

## 2023-02-10 DIAGNOSIS — G9340 Encephalopathy, unspecified: Secondary | ICD-10-CM | POA: Diagnosis not present

## 2023-02-10 DIAGNOSIS — R319 Hematuria, unspecified: Secondary | ICD-10-CM | POA: Insufficient documentation

## 2023-02-10 DIAGNOSIS — G459 Transient cerebral ischemic attack, unspecified: Secondary | ICD-10-CM | POA: Diagnosis not present

## 2023-02-10 DIAGNOSIS — Z85828 Personal history of other malignant neoplasm of skin: Secondary | ICD-10-CM | POA: Diagnosis not present

## 2023-02-10 DIAGNOSIS — E1165 Type 2 diabetes mellitus with hyperglycemia: Secondary | ICD-10-CM | POA: Insufficient documentation

## 2023-02-10 DIAGNOSIS — R531 Weakness: Secondary | ICD-10-CM | POA: Diagnosis not present

## 2023-02-10 DIAGNOSIS — R42 Dizziness and giddiness: Secondary | ICD-10-CM | POA: Diagnosis not present

## 2023-02-10 DIAGNOSIS — I10 Essential (primary) hypertension: Secondary | ICD-10-CM | POA: Diagnosis not present

## 2023-02-10 DIAGNOSIS — Z7984 Long term (current) use of oral hypoglycemic drugs: Secondary | ICD-10-CM | POA: Insufficient documentation

## 2023-02-10 DIAGNOSIS — E119 Type 2 diabetes mellitus without complications: Secondary | ICD-10-CM

## 2023-02-10 DIAGNOSIS — Z96641 Presence of right artificial hip joint: Secondary | ICD-10-CM | POA: Diagnosis not present

## 2023-02-10 DIAGNOSIS — S199XXA Unspecified injury of neck, initial encounter: Secondary | ICD-10-CM | POA: Diagnosis not present

## 2023-02-10 DIAGNOSIS — M5030 Other cervical disc degeneration, unspecified cervical region: Secondary | ICD-10-CM | POA: Diagnosis not present

## 2023-02-10 DIAGNOSIS — M4802 Spinal stenosis, cervical region: Secondary | ICD-10-CM | POA: Diagnosis not present

## 2023-02-10 DIAGNOSIS — G4730 Sleep apnea, unspecified: Secondary | ICD-10-CM | POA: Diagnosis present

## 2023-02-10 DIAGNOSIS — R110 Nausea: Secondary | ICD-10-CM | POA: Diagnosis not present

## 2023-02-10 LAB — COMPREHENSIVE METABOLIC PANEL
ALT: 23 U/L (ref 0–44)
AST: 17 U/L (ref 15–41)
Albumin: 4 g/dL (ref 3.5–5.0)
Alkaline Phosphatase: 75 U/L (ref 38–126)
Anion gap: 9 (ref 5–15)
BUN: 19 mg/dL (ref 6–20)
CO2: 23 mmol/L (ref 22–32)
Calcium: 8.7 mg/dL — ABNORMAL LOW (ref 8.9–10.3)
Chloride: 103 mmol/L (ref 98–111)
Creatinine, Ser: 0.56 mg/dL — ABNORMAL LOW (ref 0.61–1.24)
GFR, Estimated: >60 mL/min (ref >60)
Glucose, Bld: 202 mg/dL — ABNORMAL HIGH (ref 70–99)
Potassium: 3.8 mmol/L (ref 3.5–5.1)
Sodium: 135 mmol/L (ref 135–145)
Total Bilirubin: 1.4 mg/dL — ABNORMAL HIGH (ref 0.0–1.2)
Total Protein: 6.6 g/dL (ref 6.5–8.1)

## 2023-02-10 LAB — PROTIME-INR
INR: 1 (ref 0.8–1.2)
Prothrombin Time: 13.5 s (ref 11.4–15.2)

## 2023-02-10 LAB — CBC
HCT: 42.1 % (ref 39.0–52.0)
Hemoglobin: 14.6 g/dL (ref 13.0–17.0)
MCH: 31.5 pg (ref 26.0–34.0)
MCHC: 34.7 g/dL (ref 30.0–36.0)
MCV: 90.9 fL (ref 80.0–100.0)
Platelets: 188 10*3/uL (ref 150–400)
RBC: 4.63 MIL/uL (ref 4.22–5.81)
RDW: 13.2 % (ref 11.5–15.5)
WBC: 10.9 10*3/uL — ABNORMAL HIGH (ref 4.0–10.5)
nRBC: 0 % (ref 0.0–0.2)

## 2023-02-10 LAB — DIFFERENTIAL
Abs Immature Granulocytes: 0.05 10*3/uL (ref 0.00–0.07)
Basophils Absolute: 0.1 10*3/uL (ref 0.0–0.1)
Basophils Relative: 1 %
Eosinophils Absolute: 0.1 10*3/uL (ref 0.0–0.5)
Eosinophils Relative: 1 %
Immature Granulocytes: 1 %
Lymphocytes Relative: 11 %
Lymphs Abs: 1.2 10*3/uL (ref 0.7–4.0)
Monocytes Absolute: 0.6 10*3/uL (ref 0.1–1.0)
Monocytes Relative: 6 %
Neutro Abs: 8.8 10*3/uL — ABNORMAL HIGH (ref 1.7–7.7)
Neutrophils Relative %: 80 %

## 2023-02-10 LAB — CBG MONITORING, ED: Glucose-Capillary: 146 mg/dL — ABNORMAL HIGH (ref 70–99)

## 2023-02-10 LAB — ETHANOL: Alcohol, Ethyl (B): <10 mg/dL (ref <10)

## 2023-02-10 LAB — APTT: aPTT: 24 s (ref 24–36)

## 2023-02-10 NOTE — ED Notes (Signed)
CODE STROKE PAGED @ 2240 PER EMS ENCODE.  CODE STROKE CANCELLED PER DR POLLINA @2259 

## 2023-02-10 NOTE — ED Provider Notes (Signed)
[place] EMERGENCY DEPARTMENT AT [facility] Provider Note   CSN: [id number] Arrival date & time: 02/10/23  2253     History {Add pertinent medical, surgical, social history, OB history to HPI:1} Chief Complaint  Patient presents with   Weakness    Brian Melton is a 58 y.o. male.  Patient brought to the emergency department from home with altered mental status.  Last known well was 4 hours prior to arrival (7PM).  Wife told EMS that he was in the bedroom watching a football game when she heard him cry out and fall.  He was found on the ground.       Home Medications Prior to Admission medications   Medication Sig Start Date End Date Taking? Authorizing Provider  atorvastatin (LIPITOR) 10 MG tablet Take 10 mg by mouth at bedtime.     [provider]  dapagliflozin propanediol (FARXIGA) 10 MG TABS tablet Take 10 mg by mouth daily.    [provider]  glipiZIDE (GLUCOTROL XL) 5 MG 24 hr tablet Take 10 mg by mouth in the morning and at bedtime.    [provider]  HYDROcodone-acetaminophen (NORCO) 10-325 MG tablet Take 0.5-1 tablets by mouth every 6 (six) hours as needed for severe pain. 05/25/21   Edmisten, Lyn Hollingshead, PA  HYDROcodone-acetaminophen (NORCO/VICODIN) 5-325 MG tablet Take 1-2 tablets by mouth every 6 (six) hours as needed for severe pain. 05/25/21   Edmisten, Kristie L, PA  losartan (COZAAR) 100 MG tablet Take 100 mg by mouth daily.    [provider]  metFORMIN (GLUCOPHAGE-XR) 500 MG 24 hr tablet Take 1,000 mg by mouth 2 (two) times daily.    [provider]  methocarbamol (ROBAXIN) 500 MG tablet Take 1 tablet (500 mg total) by mouth every 6 (six) hours as needed for muscle spasms. 05/25/21   Edmisten, Kristie L, PA  metoprolol succinate (TOPROL-XL) 25 MG 24 hr tablet Take 1 tablet (25 mg total) by mouth daily. Must call [phone] to schedule an appt to continue refills. Thank you. 1st attempt 12/02/18   Jake Bathe, MD  pioglitazone (ACTOS) 30 MG tablet Take 30 mg by mouth daily. 03/23/21   [provider]  traMADol (ULTRAM) 50 MG tablet Take 1-2 tablets (50-100 mg total) by mouth every 6 (six) hours as needed for moderate pain. 05/25/21   Edmisten, Lyn Hollingshead, PA      Allergies    Penicillins    Review of Systems   Review of Systems  Physical Exam Updated Vital Signs BP 119/71   Pulse 76   Resp 18   SpO2 95%  Physical Exam Vitals and nursing note reviewed.  Constitutional:      General: He is not in acute distress.    Appearance: He is well-developed.  HENT:     Head: Normocephalic and atraumatic.     Mouth/Throat:     Mouth: Mucous membranes are moist.  Eyes:     General: Vision grossly intact. Gaze aligned appropriately.     Extraocular Movements: Extraocular movements intact.     Conjunctiva/sclera: Conjunctivae normal.  Cardiovascular:     Rate and Rhythm: Normal rate and regular rhythm.     Pulses: Normal pulses.     Heart sounds: Normal heart sounds, S1 normal and S2 normal. No murmur heard.    No friction rub. No gallop.  Pulmonary:     Effort: Pulmonary effort is normal. No respiratory distress.     Breath sounds:  Normal breath sounds.  Abdominal:     Palpations: Abdomen is soft.     Tenderness: There is no abdominal tenderness. There is no guarding or rebound.     Hernia: No hernia is present.  Musculoskeletal:        General: No swelling.     Cervical back: Full passive range of motion without pain, normal range of motion and neck supple. No pain with movement, spinous process tenderness or muscular tenderness. Normal range of motion.     Right lower leg: No edema.     Left lower leg: No edema.  Skin:    General: Skin is warm and dry.     Capillary Refill: Capillary refill takes less than 2 seconds.     Findings: No ecchymosis, erythema, lesion or wound.  Neurological:     Mental Status: He is alert.     GCS: GCS eye subscore is 4. GCS verbal subscore  is 5. GCS motor subscore is 6.     Cranial Nerves: Cranial nerves 2-12 are intact.     Sensory: Sensation is intact.     Motor: Motor function is intact. No weakness or abnormal muscle tone.     Coordination: Coordination is intact.     Comments: Patient able to follow commands but is very slow to respond  Psychiatric:        Mood and Affect: Mood normal.        Speech: Speech normal.        Behavior: Behavior normal.     ED Results / Procedures / Treatments   Labs (all labs ordered are listed, but only abnormal results are displayed) Labs Reviewed  CBG MONITORING, ED - Abnormal; Notable for the following components:      Result Value   Glucose-Capillary 146 (*)    All other components within normal limits  ETHANOL  PROTIME-INR  APTT  CBC  DIFFERENTIAL  COMPREHENSIVE METABOLIC PANEL  ETHANOL  RAPID URINE DRUG SCREEN, HOSP PERFORMED  URINALYSIS, ROUTINE W REFLEX MICROSCOPIC    EKG None  Radiology No results found.  Procedures Procedures  {Document cardiac monitor, telemetry assessment procedure when appropriate:1}  Medications Ordered in ED Medications - No data to display  ED Course/ Medical Decision Making/ A&P   {   Click here for ABCD2, HEART and other calculatorsREFRESH Note before signing :1}                              Medical Decision Making Amount and/or Complexity of Data Reviewed Labs: ordered. Radiology: ordered.   ***  {Document critical care time when appropriate:1} {Document review of labs and clinical decision tools ie heart score, Chads2Vasc2 etc:1}  {Document your independent review of radiology images, and any outside records:1} {Document your discussion with family members, caretakers, and with consultants:1} {Document social determinants of health affecting pt's care:1} {Document your decision making why or why not admission, treatments were needed:1} Final Clinical Impression(s) / ED Diagnoses Final diagnoses:  None    Rx / DC  Orders ED Discharge Orders     None

## 2023-02-10 NOTE — ED Triage Notes (Signed)
Patient from home for reports of weakness, dizziness, and not walking or talking "right". EMS reports LKN was 1900; family found patient in bathroom, unknown if he fell. EMS reports patient was ambulatory to their stretcher on arrival. Patient also reports nausea. EMS administered 4mg  Zofran IV via 18G IV in the LAC. Upon arrival to ER, patient is alert and oriented, answers questions appropriately but with hesitation. Patient noted to have generalized weakness, reports numbness to the L arm. Denies blurry or double vision. Patient does wear glasses at baseline but reports his current vision is at his baseline.

## 2023-02-10 NOTE — Progress Notes (Signed)
Activation time: 2253 EDP assessing on arrival Cancellation time: 2257 Reason for cancelling: Non focal deficits, outside window for tnk Cancelled by: EDP Pollina

## 2023-02-11 ENCOUNTER — Observation Stay (HOSPITAL_COMMUNITY): Payer: BC Managed Care – PPO

## 2023-02-11 ENCOUNTER — Observation Stay (HOSPITAL_COMMUNITY)
Admit: 2023-02-11 | Discharge: 2023-02-11 | Disposition: A | Discharge status: Home / Self Care | Payer: BC Managed Care – PPO | Attending: Emergency Medicine | Admitting: Emergency Medicine

## 2023-02-11 DIAGNOSIS — R569 Unspecified convulsions: Secondary | ICD-10-CM

## 2023-02-11 DIAGNOSIS — G459 Transient cerebral ischemic attack, unspecified: Secondary | ICD-10-CM | POA: Diagnosis present

## 2023-02-11 DIAGNOSIS — E236 Other disorders of pituitary gland: Secondary | ICD-10-CM | POA: Diagnosis not present

## 2023-02-11 DIAGNOSIS — I700 Atherosclerosis of aorta: Secondary | ICD-10-CM | POA: Diagnosis not present

## 2023-02-11 DIAGNOSIS — I6529 Occlusion and stenosis of unspecified carotid artery: Secondary | ICD-10-CM | POA: Diagnosis not present

## 2023-02-11 DIAGNOSIS — G9340 Encephalopathy, unspecified: Secondary | ICD-10-CM | POA: Diagnosis not present

## 2023-02-11 DIAGNOSIS — S199XXA Unspecified injury of neck, initial encounter: Secondary | ICD-10-CM | POA: Diagnosis not present

## 2023-02-11 DIAGNOSIS — M5030 Other cervical disc degeneration, unspecified cervical region: Secondary | ICD-10-CM | POA: Diagnosis not present

## 2023-02-11 DIAGNOSIS — R2990 Unspecified symptoms and signs involving the nervous system: Secondary | ICD-10-CM

## 2023-02-11 DIAGNOSIS — R4182 Altered mental status, unspecified: Secondary | ICD-10-CM | POA: Diagnosis not present

## 2023-02-11 DIAGNOSIS — R531 Weakness: Secondary | ICD-10-CM | POA: Diagnosis not present

## 2023-02-11 DIAGNOSIS — E1165 Type 2 diabetes mellitus with hyperglycemia: Secondary | ICD-10-CM

## 2023-02-11 DIAGNOSIS — M4802 Spinal stenosis, cervical region: Secondary | ICD-10-CM | POA: Diagnosis not present

## 2023-02-11 LAB — RAPID URINE DRUG SCREEN, HOSP PERFORMED
Amphetamines: NOT DETECTED
Barbiturates: NOT DETECTED
Benzodiazepines: NOT DETECTED
Cocaine: NOT DETECTED
Opiates: NOT DETECTED
Tetrahydrocannabinol: NOT DETECTED

## 2023-02-11 LAB — LIPID PANEL
Cholesterol: 84 mg/dL (ref 0–200)
HDL: 38 mg/dL — ABNORMAL LOW (ref >40)
LDL Cholesterol: 33 mg/dL (ref 0–99)
Total CHOL/HDL Ratio: 2.2 {ratio}
Triglycerides: 63 mg/dL (ref <150)
VLDL: 13 mg/dL (ref 0–40)

## 2023-02-11 LAB — GLUCOSE, CAPILLARY
Glucose-Capillary: 88 mg/dL (ref 70–99)
Glucose-Capillary: 82 mg/dL (ref 70–99)
Glucose-Capillary: 97 mg/dL (ref 70–99)

## 2023-02-11 LAB — URINALYSIS, ROUTINE W REFLEX MICROSCOPIC
Bacteria, UA: NONE SEEN
Bilirubin Urine: NEGATIVE
Glucose, UA: >=500 mg/dL — AB
Ketones, ur: NEGATIVE mg/dL
Leukocytes,Ua: NEGATIVE
Nitrite: NEGATIVE
Protein, ur: NEGATIVE mg/dL
Specific Gravity, Urine: 1.036 — ABNORMAL HIGH (ref 1.005–1.030)
pH: 6 (ref 5.0–8.0)

## 2023-02-11 LAB — HEMOGLOBIN A1C
Hgb A1c MFr Bld: 6.8 % — ABNORMAL HIGH (ref 4.8–5.6)
Mean Plasma Glucose: 148.46 mg/dL

## 2023-02-11 LAB — CK: Total CK: 80 U/L (ref 49–397)

## 2023-02-11 LAB — HIV ANTIBODY (ROUTINE TESTING W REFLEX): HIV Screen 4th Generation wRfx: NONREACTIVE

## 2023-02-11 MED ORDER — INSULIN ASPART 100 UNIT/ML IJ SOLN: 0.0000 [IU] | Freq: Three times a day (TID) | INTRAMUSCULAR | Status: DC

## 2023-02-11 MED ORDER — IOHEXOL 350 MG/ML SOLN
75.0000 mL | Freq: Once | INTRAVENOUS | Status: AC | PRN
  Administered 2023-02-11: 75 mL via INTRAVENOUS

## 2023-02-11 MED ORDER — LORAZEPAM 2 MG/ML IJ SOLN: 2.0000 mg | INTRAMUSCULAR | Status: DC | PRN

## 2023-02-11 MED ORDER — ONDANSETRON HCL 4 MG/2ML IJ SOLN: 4.0000 mg | Freq: Four times a day (QID) | INTRAMUSCULAR | Status: DC | PRN

## 2023-02-11 MED ORDER — POLYETHYLENE GLYCOL 3350 17 G PO PACK: 17.0000 g | PACK | Freq: Every day | ORAL | Status: DC | PRN

## 2023-02-11 MED ORDER — GADOBUTROL 1 MMOL/ML IV SOLN
10.0000 mL | Freq: Once | INTRAVENOUS | Status: AC | PRN
  Administered 2023-02-11: 10 mL via INTRAVENOUS

## 2023-02-11 MED ORDER — METOPROLOL SUCCINATE ER 50 MG PO TB24
25.0000 mg | ORAL_TABLET | Freq: Every day | ORAL | Status: DC
  Administered 2023-02-11: 25 mg via ORAL
  Filled 2023-02-11: qty 1

## 2023-02-11 MED ORDER — MELOXICAM 7.5 MG PO TABS: 7.5000 mg | ORAL_TABLET | Freq: Every day | ORAL | Status: DC | PRN

## 2023-02-11 MED ORDER — SODIUM CHLORIDE 0.9% FLUSH
3.0000 mL | Freq: Two times a day (BID) | INTRAVENOUS | Status: DC
  Administered 2023-02-11 (×2): 3 mL via INTRAVENOUS

## 2023-02-11 MED ORDER — ACETAMINOPHEN 500 MG PO TABS: 1000.0000 mg | ORAL_TABLET | Freq: Four times a day (QID) | ORAL | Status: DC | PRN

## 2023-02-11 MED ORDER — ATORVASTATIN CALCIUM 10 MG PO TABS: 10.0000 mg | ORAL_TABLET | Freq: Every day | ORAL | Status: DC

## 2023-02-11 MED ORDER — ASPIRIN 81 MG PO CHEW
81.0000 mg | CHEWABLE_TABLET | Freq: Every day | ORAL | Status: DC
  Administered 2023-02-11: 81 mg via ORAL
  Filled 2023-02-11: qty 1

## 2023-02-11 MED ORDER — MELATONIN 3 MG PO TABS: 6.0000 mg | ORAL_TABLET | Freq: Every evening | ORAL | Status: DC | PRN

## 2023-02-11 MED ORDER — ASPIRIN 81 MG PO CHEW: 81.0000 mg | CHEWABLE_TABLET | Freq: Every day | ORAL | Status: AC

## 2023-02-11 MED ORDER — INSULIN ASPART 100 UNIT/ML IJ SOLN: 0.0000 [IU] | Freq: Every day | INTRAMUSCULAR | Status: DC

## 2023-02-11 NOTE — Discharge Instructions (Signed)
IMPORTANT INFORMATION: PAY CLOSE ATTENTION   PHYSICIAN DISCHARGE INSTRUCTIONS  Follow with Primary care provider  Sheliah Hatch, PA-C  and other consultants as instructed by your Hospitalist Physician  SEEK MEDICAL CARE OR RETURN TO EMERGENCY ROOM IF SYMPTOMS COME BACK, WORSEN OR NEW PROBLEM DEVELOPS   Please note: You were cared for by a hospitalist during your hospital stay. Every effort will be made to forward records to your primary care provider.  You can request that your primary care provider send for your hospital records if they have not received them.  Once you are discharged, your primary care physician will handle any further medical issues. Please note that NO REFILLS for any discharge medications will be authorized once you are discharged, as it is imperative that you return to your primary care physician (or establish a relationship with a primary care physician if you do not have one) for your post hospital discharge needs so that they can reassess your need for medications and monitor your lab values.  Please get a complete blood count and chemistry panel checked by your Primary MD at your next visit, and again as instructed by your Primary MD.  Get Medicines reviewed and adjusted: Please take all your medications with you for your next visit with your Primary MD  Laboratory/radiological data: Please request your Primary MD to go over all hospital tests and procedure/radiological results at the follow up, please ask your primary care provider to get all Hospital records sent to his/her office.  In some cases, they will be blood work, cultures and biopsy results pending at the time of your discharge. Please request that your primary care provider follow up on these results.  If you are diabetic, please bring your blood sugar readings with you to your follow up appointment with primary care.    Please call and make your follow up appointments as soon as possible.    Also  Note the following: If you experience worsening of your admission symptoms, develop shortness of breath, life threatening emergency, suicidal or homicidal thoughts you must seek medical attention immediately by calling 911 or calling your MD immediately  if symptoms less severe.  You must read complete instructions/literature along with all the possible adverse reactions/side effects for all the Medicines you take and that have been prescribed to you. Take any new Medicines after you have completely understood and accpet all the possible adverse reactions/side effects.   Do not drive when taking Pain medications or sleeping medications (Benzodiazepines)  Do not take more than prescribed Pain, Sleep and Anxiety Medications. It is not advisable to combine anxiety,sleep and pain medications without talking with your primary care practitioner  Special Instructions: If you have smoked or chewed Tobacco  in the last 2 yrs please stop smoking, stop any regular Alcohol  and or any Recreational drug use.  Wear Seat belts while driving.  Do not drive if taking any narcotic, mind altering or controlled substances or recreational drugs or alcohol.

## 2023-02-11 NOTE — H&P (Signed)
History and Physical    EMMERT NICHTER AOZ:[id number] DOB: [birth year] DOA: 02/10/2023  PCP: Sheliah Hatch, PA-C   Patient coming from: Home   Chief Complaint:  Chief Complaint  Patient presents with   Weakness    HPI:  Brian Melton is a 58 y.o. male with hx of diabetes, hypertension, HLD, left bundle branch block, OA, lumbar stenosis, who was brought in by EMS after being found down in the bathroom disoriented with left-sided weakness and slurred speech.  History is provided by patient and then supplemented by son and wife at the bedside.  Patient had initially been more encephalopathic and slow to respond but appears improving by time of my interview.  He reports and family confirms LKWT actually 9 to 9:30 PM.  Patient reports he was getting ready to go to bed he suddenly began to feel dizzy and disoriented, nauseous.  He got up to go to the bathroom and then did his symptoms yelled out for his wife.  His son and wife found him on the ground.  He was disoriented, unable to recognize his son.  He had slurred speech.  Per son he had weakness on the left side of his body unable to move his left leg or stand.  He had shaking in the right arm.  Patient reports he noted having left arm numbness and feeling like left leg was heavier/weak.  No numbness or weakness on the right side.  Speech is come back towards his normal, no longer slurred.  Denies visual changes.  No recent falls or head injuries prior to today.  No history of stroke or seizure in the past.  Wife gave him aspirin 325 mg at home.  Wife is a retired Charity fundraiser actually used to work at [employer]  Review of Systems:  Otherwise had few days of stomach upset/nausea leading up to the events tonight.  ROS complete and negative except as marked above   Allergies  Allergen Reactions   Penicillins     UNSPECIFIED CHILDHOOD REACTION  Has patient had a PCN reaction causing immediate rash, facial/tongue/throat swelling, SOB or  lightheadedness with hypotension: No Has patient had a PCN reaction causing severe rash involving mucus membranes or skin necrosis: No Has patient had a PCN reaction that required hospitalization No Has patient had a PCN reaction occurring within the last 10 years: No  Tolerated Cephalosporin Date: 05/25/21.    Prior to Admission medications   Medication Sig Start Date End Date Taking? Authorizing Provider  atorvastatin (LIPITOR) 10 MG tablet Take 10 mg by mouth at bedtime.     [provider]  dapagliflozin propanediol (FARXIGA) 10 MG TABS tablet Take 10 mg by mouth daily.    [provider]  glipiZIDE (GLUCOTROL XL) 5 MG 24 hr tablet Take 10 mg by mouth in the morning and at bedtime.    [provider]  HYDROcodone-acetaminophen (NORCO) 10-325 MG tablet Take 0.5-1 tablets by mouth every 6 (six) hours as needed for severe pain. 05/25/21   Edmisten, Lyn Hollingshead, PA  HYDROcodone-acetaminophen (NORCO/VICODIN) 5-325 MG tablet Take 1-2 tablets by mouth every 6 (six) hours as needed for severe pain. 05/25/21   Edmisten, Kristie L, PA  losartan (COZAAR) 100 MG tablet Take 100 mg by mouth daily.    [provider]  metFORMIN (GLUCOPHAGE-XR) 500 MG 24 hr tablet Take 1,000 mg by mouth 2 (two) times daily.    [provider]  methocarbamol (ROBAXIN) 500 MG tablet Take 1  tablet (500 mg total) by mouth every 6 (six) hours as needed for muscle spasms. 05/25/21   Edmisten, Kristie L, PA  metoprolol succinate (TOPROL-XL) 25 MG 24 hr tablet Take 1 tablet (25 mg total) by mouth daily. Must call [phone] to schedule an appt to continue refills. Thank you. 1st attempt 12/02/18   Jake Bathe, MD  pioglitazone (ACTOS) 30 MG tablet Take 30 mg by mouth daily. 03/23/21   [provider]  traMADol (ULTRAM) 50 MG tablet Take 1-2 tablets (50-100 mg total) by mouth every 6 (six) hours as needed for moderate pain. 05/25/21   Edmisten, Lyn Hollingshead, PA    Past Medical History:   Diagnosis Date   Arthritis    Cancer (HCC)    skin cancer   Diabetes mellitus without complication (HCC)    Heart murmur    RBBB   History of kidney stones    Pneumonia    Sleep apnea    uses cpap   Umbilical hernia     Past Surgical History:  Procedure Laterality Date   BACK SURGERY     COLONOSCOPY N/A 11/22/2015   Procedure: COLONOSCOPY;  Surgeon: Malissa Hippo, MD;  Location: [facility];  Service: Endoscopy;  Laterality: N/A;  12:15   ear drum surgery     infant.   TONSILLECTOMY     TOTAL HIP ARTHROPLASTY Right 05/24/2021   Procedure: TOTAL HIP ARTHROPLASTY ANTERIOR APPROACH;  Surgeon: Ollen Gross, MD;  Location: [facility];  Service: Orthopedics;  Laterality: Right;     reports that he has never smoked. His smokeless tobacco use includes chew. He reports current alcohol use. He reports that he does not use drugs.  Family History  Problem Relation Age of Onset   Diabetes Mother    Bell's palsy Mother      Physical Exam: Vitals:   02/11/23 0100 02/11/23 0130 02/11/23 0200 02/11/23 0230  BP: 124/74 117/76 133/82 137/73  Pulse: 65 66 68 69  Resp: 16 16  17   Temp:      TempSrc:      SpO2: 96% 95% 97% 97%  Weight:      Height:        Gen: Awake, alert, NAD   CV: Regular, normal S1, S2, 1/6 SEM Resp: Normal WOB, CTAB  Abd: Flat, normoactive, nontender MSK: Symmetric, no edema  Skin: No rashes or lesions to exposed skin  Neuro: Alert and interactive, fully oriented, increased latency of response, speech is clear without aphasia, naming intact, CN II through XII intact.  Motor is 4 out of 5 throughout the left upper and left lower extremity.  5 out of 5 on the right.  Sensation is intact and equal to fine touch.  There is dysmetria with finger-to-nose and heel-to-shin on the left side.  Psych: euthymic, appropriate    Data review:   Labs reviewed, notable for:   Glucose 2 2 WBC 10 UA moderate blood, 6-10 RBC Tox negative, alcohol negative  Micro:   Results for orders placed or performed during the hospital encounter of 05/15/21  Surgical pcr screen     Status: None   Collection Time: 05/15/21 10:46 AM   Specimen: Nasal Mucosa; Nasal Swab  Result Value Ref Range Status   MRSA, PCR NEGATIVE NEGATIVE Final   Staphylococcus aureus NEGATIVE NEGATIVE Final    Comment: (NOTE) The Xpert SA Assay (FDA approved for NASAL specimens in patients 11 years of age and older), is one component of a comprehensive  surveillance program. It is not intended to diagnose infection nor to guide or monitor treatment. Performed at [facility], [address], [place], [place] [address]     Imaging reviewed:  CT CERVICAL SPINE WO CONTRAST Result Date: 02/11/2023 CLINICAL DATA:  Neck trauma, dangerous injury mechanism (Age 65-64y) EXAM: CT CERVICAL SPINE WITHOUT CONTRAST TECHNIQUE: Multidetector CT imaging of the cervical spine was performed without intravenous contrast. Multiplanar CT image reconstructions were also generated. RADIATION DOSE REDUCTION: This exam was performed according to the departmental dose-optimization program which includes automated exposure control, adjustment of the mA and/or kV according to patient size and/or use of iterative reconstruction technique. COMPARISON:  None Available. FINDINGS: Alignment: Normal Skull base and vertebrae: No acute fracture. No primary bone lesion or focal pathologic process. Soft tissues and spinal canal: No prevertebral fluid or swelling. No visible canal hematoma. Disc levels: Diffuse degenerative disc disease with disc space narrowing and spurring. Moderate bilateral degenerative facet disease, left greater than right. Upper chest: No acute findings Other: None IMPRESSION: Degenerative disc and facet disease.  No acute bony abnormality. Electronically Signed   By: Charlett Nose M.D.   On: 02/11/2023 00:14   CT HEAD WO CONTRAST Result Date: 02/11/2023 CLINICAL DATA:  Mental status changes  EXAM: CT HEAD WITHOUT CONTRAST TECHNIQUE: Contiguous axial images were obtained from the base of the skull through the vertex without intravenous contrast. RADIATION DOSE REDUCTION: This exam was performed according to the departmental dose-optimization program which includes automated exposure control, adjustment of the mA and/or kV according to patient size and/or use of iterative reconstruction technique. COMPARISON:  None Available. FINDINGS: Brain: No acute intracranial abnormality. Specifically, no hemorrhage, hydrocephalus, mass lesion, acute infarction, or significant intracranial injury. Vascular: No hyperdense vessel or unexpected calcification. Skull: No acute calvarial abnormality. Sinuses/Orbits: No acute findings Other: None IMPRESSION: No acute intracranial abnormality. Electronically Signed   By: Charlett Nose M.D.   On: 02/11/2023 00:13    EKG: \ Personally reviewed Sinus rhythm, borderline 1st deg AV block, LAD, LBBB  ED Course:  Arrived as a prehospital stroke alert, CT head with no acute abnormality.  Was seen by teleneurology, LKWT thought to be 7 PM for ED/neuro documentation.  Was out of window for TNK based on this.  Plan for evaluation with MRI brain with and without contrast, EEG   Assessment/Plan:  59 y.o. male with hx diabetes, hypertension, HLD, left bundle branch block, OA, lumbar stenosis, who was brought in by EMS after being found down in the bathroom disoriented with left-sided weakness and slurred speech, right-sided shaking.    Stroke-like symptoms Possible seizure activity LKWT confirmed with patient and family is 1/19 at 9 to 9:30 PM.  Presenting symptoms left-sided weakness, slurred speech, disorientation, right side shaking. NIHSS per teleneuro 0.  On my exam he has 4-5 weakness in the left upper and lower extremity, dysmetria on the left with finger-nose/heel-to-shin. CT head with no acute abnormality.  -EDP is consulted with teleneurology, was out of window  for TNK based on original impression of LKWT, revised per assessment above.  At time of my admission he is out of the window based on new LKWT so code stroke was not reactivated.  Teleneuro recommending for MRI brain with and without contrast, routine EEG which have been ordered. - With focal deficits noted on exam, proceed to vessel imaging as MRI will be delayed later in the morning.  CTA head and neck ordered. -Status post aspirin 325 mg x 1  at home, continue 81 mg daily -Serial neurochecks -Telemetry -Check lipids and A1c, escalation in statin dosing pending results -Hold antihypertensives in case there is an acute stroke  Incidental findings Hematuria versus component of rhabdo: Check CK rule out rhabdo.  If hematuria needs repeated follow-up outpatient  Chronic medical problems: Diabetes type 2 with hyperglycemia: Home regimen is glipizide, metformin, Farxiga, pioglitazone, Rybelsus. Check A1c.  SSI while inpatient Hypertension: Hold home losartan, metoprolol pending MRI result Hyperlipidemia: Continue atorvastatin 10 mg daily for now, escalation pending stroke eval above/lipid panel OA/lumbar stenosis  Body mass index is 33.49 kg/m.  Obesity class I impacting medical care, with benefit from continued weight loss  DVT prophylaxis:  SCDs Code Status:  Full Code Diet:  Diet Orders (From admission, onward)     Start     Ordered   02/11/23 0219  Diet Carb Modified Fluid consistency: Thin; Room service appropriate? Yes  Diet effective now       Comments: Ok for cc diet after swallow screen  Question Answer Comment  Diet-HS Snack? Nothing   Calorie Level Medium 1600-2000   Fluid consistency: Thin   Room service appropriate? Yes      02/11/23 0220           Family Communication: Yes discussed with his wife and son at the bedside Consults: Teleneurology Admission status:   Inpatient, Telemetry bed  Severity of Illness: The appropriate patient status for this patient is  INPATIENT. Inpatient status is judged to be reasonable and necessary in order to provide the required intensity of service to ensure the patient's safety. The patient's presenting symptoms, physical exam findings, and initial radiographic and laboratory data in the context of their chronic comorbidities is felt to place them at high risk for further clinical deterioration. Furthermore, it is not anticipated that the patient will be medically stable for discharge from the hospital within 2 midnights of admission.   * I certify that at the Melton of admission it is my clinical judgment that the patient will require inpatient hospital care spanning beyond 2 midnights from the Melton of admission due to high intensity of service, high risk for further deterioration and high frequency of surveillance required.*   Dolly Rias, MD [facility]  How to contact the [facility] Attending or Consulting provider 7A - 7P or covering provider during after hours 7P -7A, for this patient.  Check the care team in [facility] and look for a) attending/consulting [facility] provider listed and b) the [facility] team listed Log into [email or web address] and use [facility] universal password to access. If you do not have the password, please contact the hospital operator. Locate the [facility] provider you are looking for under [facility] and page to a number that you can be directly reached. If you still have difficulty reaching the provider, please page the [facility] (Director on Call) for the Hospitalists listed on amion for assistance.  02/11/2023, 3:06 AM

## 2023-02-11 NOTE — Procedures (Signed)
Patient Name: Brian Melton  MRN: [id number]  Epilepsy Attending: Charlsie Quest  Referring Physician/Provider: Gilda Crease, MD  Date: 02/11/2023 Duration: 22.31 mins  Patient history: 58yo M with ams getting eeg to evaluate for seizure  Level of alertness: Awake,   AEDs during EEG study: None  Technical aspects: This EEG study was done with scalp electrodes positioned according to the 10-20 International system of electrode placement. Electrical activity was reviewed with band pass filter of 1-70Hz , sensitivity of 7 uV/mm, display speed of 43mm/sec with a 60Hz  notched filter applied as appropriate. EEG data were recorded continuously and digitally stored.  Video monitoring was available and reviewed as appropriate.  Description: The posterior dominant rhythm consists of 8-9 Hz activity of moderate voltage (25-35 uV) seen predominantly in posterior head regions, symmetric and reactive to eye opening and eye closing. Sleep was characterized by vertex waves, sleep spindles (12 to 14 Hz), maximal frontocentral region. Hyperventilation and photic stimulation were not performed.     IMPRESSION: This study is within normal limits. No seizures or epileptiform discharges were seen throughout the recording.  A normal interictal EEG does not exclude the diagnosis of epilepsy.   Starla Deller Annabelle Harman

## 2023-02-11 NOTE — Progress Notes (Signed)
EEG complete - results pending 

## 2023-02-11 NOTE — Progress Notes (Signed)
   02/11/23 1159  TOC Brief Assessment  Insurance and Status Reviewed  Patient has primary care physician Yes  Home environment has been reviewed Home with Spouse  Prior level of function: independent  Prior/Current Home Services No current home services  Social Drivers of Health Review SDOH reviewed no interventions necessary  Readmission risk has been reviewed Yes  Transition of care needs no transition of care needs at this time   Transition of Care Department [facility]) has reviewed patient and no TOC needs have been identified at this time. We will continue to monitor patient advancement through interdisciplinary progression rounds. If new patient transition needs arise, please place a TOC consult.

## 2023-02-11 NOTE — Plan of Care (Signed)

## 2023-02-11 NOTE — Discharge Summary (Signed)
Physician Discharge Summary  Brian Melton VOZ:[id number] DOB: May 07, 1965 DOA: 02/10/2023  PCP: Sheliah Hatch, PA-C  Admit date: 02/10/2023 Discharge date: 02/11/2023  Admitted From:  Home  Disposition: Home   Recommendations for Outpatient Follow-up:  Follow up with PCP in 1 weeks Follow up with neurology in 4 weeks  Discharge Condition: STABLE   CODE STATUS: FULL DIET: resume heart healthy    Brief Hospitalization Summary: Please see all hospital notes, images, labs for full details of the hospitalization. Admission provider HPI:  58 y.o. male with hx of diabetes, hypertension, HLD, left bundle branch block, OA, lumbar stenosis, who was brought in by EMS after being found down in the bathroom disoriented with left-sided weakness and slurred speech.  History is provided by patient and then supplemented by son and wife at the bedside.  Patient had initially been more encephalopathic and slow to respond but appears improving by time of my interview.  He reports and family confirms LKWT actually 9 to 9:30 PM.  Patient reports he was getting ready to go to bed he suddenly began to feel dizzy and disoriented, nauseous.  He got up to go to the bathroom and then did his symptoms yelled out for his wife.  His son and wife found him on the ground.  He was disoriented, unable to recognize his son.  He had slurred speech.  Per son he had weakness on the left side of his body unable to move his left leg or stand.  He had shaking in the right arm.  Patient reports he noted having left arm numbness and feeling like left leg was heavier/weak.  No numbness or weakness on the right side.  Speech is come back towards his normal, no longer slurred.  Denies visual changes.  No recent falls or head injuries prior to today.  No history of stroke or seizure in the past.  Wife gave him aspirin 325 mg at home.  Wife is a retired Charity fundraiser actually used to work at [employer] Course  Pt was admitted with acute  neurological changes for full stroke vs TIA work up.  He was seen by teleneurologist who recommended daily aspirin 81 mg for secondary stroke prevention.  He also recommended a routine EEG to assess for underlying seizure disorder.  The EEG was completed and was read by neurologist as negative for epileptic activity.  The patient also went to have a MRI brain with and without contrast which was negative for acute stroke.  His other workup has been reassuring.  He is feeling back to his normal self at baseline and eager to go home.  Will make a referral for him to follow-up with neurology in the outpatient setting.  He is discharging home in stable condition.  Discharge Diagnoses:  Principal Problem:   TIA (transient ischemic attack) Active Problems:   Encephalopathy   Diabetes (HCC)   Sleep apnea   Discharge Instructions: Discharge Instructions     Ambulatory referral to Neurology   Complete by: As directed    An appointment is requested in approximately: 4 weeks      Allergies as of 02/11/2023       Reactions   Penicillins    UNSPECIFIED CHILDHOOD REACTION         Medication List     TAKE these medications    aspirin 81 MG chewable tablet Chew 1 tablet (81 mg total) by mouth daily. Start taking on: February 12, 2023  aspirin-sod bicarb-citric acid 325 MG Tbef tablet Commonly known as: ALKA-SELTZER Take 325 mg by mouth every 6 (six) hours as needed (Pain).   atorvastatin 10 MG tablet Commonly known as: LIPITOR Take 10 mg by mouth at bedtime.   dapagliflozin propanediol 10 MG Tabs tablet Commonly known as: FARXIGA Take 10 mg by mouth daily.   glipiZIDE 5 MG 24 hr tablet Commonly known as: GLUCOTROL XL Take 5 mg by mouth in the morning and at bedtime.   losartan 100 MG tablet Commonly known as: COZAAR Take 100 mg by mouth daily.   meloxicam 7.5 MG tablet Commonly known as: MOBIC Take 7.5 mg by mouth 2 (two) times daily.   metFORMIN 500 MG 24 hr  tablet Commonly known as: GLUCOPHAGE-XR Take 1,000 mg by mouth 2 (two) times daily.   metoprolol succinate 25 MG 24 hr tablet Commonly known as: TOPROL-XL Take 1 tablet (25 mg total) by mouth daily. Must call [phone] to schedule an appt to continue refills. Thank you. 1st attempt   pioglitazone 45 MG tablet Commonly known as: ACTOS Take 45 mg by mouth daily.   Rybelsus 7 MG Tabs Generic drug: Semaglutide Take 7 tablets by mouth daily.        Follow-up Information     Sheliah Hatch, PA-C. Schedule an appointment as soon as possible for a visit in 1 week(s).   Specialty: Family Medicine Why: Hospital Follow Up Contact information: [address] [place] [address] [phone]         [facility]. Schedule an appointment as soon as possible for a visit in 1 month(s).   Specialty: Neurology Why: Hospital Follow Up Contact information: [address] [place] [address] [phone]               Allergies  Allergen Reactions   Penicillins     UNSPECIFIED CHILDHOOD REACTION    Allergies as of 02/11/2023       Reactions   Penicillins    UNSPECIFIED CHILDHOOD REACTION         Medication List     TAKE these medications    aspirin 81 MG chewable tablet Chew 1 tablet (81 mg total) by mouth daily. Start taking on: February 12, 2023   aspirin-sod bicarb-citric acid 325 MG Tbef tablet Commonly known as: ALKA-SELTZER Take 325 mg by mouth every 6 (six) hours as needed (Pain).   atorvastatin 10 MG tablet Commonly known as: LIPITOR Take 10 mg by mouth at bedtime.   dapagliflozin propanediol 10 MG Tabs tablet Commonly known as: FARXIGA Take 10 mg by mouth daily.   glipiZIDE 5 MG 24 hr tablet Commonly known as: GLUCOTROL XL Take 5 mg by mouth in the morning and at bedtime.   losartan 100 MG tablet Commonly known as: COZAAR Take 100 mg by mouth daily.   meloxicam 7.5 MG  tablet Commonly known as: MOBIC Take 7.5 mg by mouth 2 (two) times daily.   metFORMIN 500 MG 24 hr tablet Commonly known as: GLUCOPHAGE-XR Take 1,000 mg by mouth 2 (two) times daily.   metoprolol succinate 25 MG 24 hr tablet Commonly known as: TOPROL-XL Take 1 tablet (25 mg total) by mouth daily. Must call [phone] to schedule an appt to continue refills. Thank you. 1st attempt   pioglitazone 45 MG tablet Commonly known as: ACTOS Take 45 mg by mouth daily.   Rybelsus 7 MG Tabs Generic drug: Semaglutide Take 7 tablets by  mouth daily.        Procedures/Studies: EEG adult Result Date: 02/11/2023 Charlsie Quest, MD     02/11/2023  4:28 PM Patient Name: Brian Melton MRN: [id number] Epilepsy Attending: Charlsie Quest Referring Physician/Provider: Gilda Crease, MD Date: 02/11/2023 Duration: 22.31 mins Patient history: 57yo M with ams getting eeg to evaluate for seizure Level of alertness: Awake, AEDs during EEG study: None Technical aspects: This EEG study was done with scalp electrodes positioned according to the 10-20 International system of electrode placement. Electrical activity was reviewed with band pass filter of 1-70Hz , sensitivity of 7 uV/mm, display speed of 29mm/sec with a 60Hz  notched filter applied as appropriate. EEG data were recorded continuously and digitally stored.  Video monitoring was available and reviewed as appropriate. Description: The posterior dominant rhythm consists of 8-9 Hz activity of moderate voltage (25-35 uV) seen predominantly in posterior head regions, symmetric and reactive to eye opening and eye closing. Sleep was characterized by vertex waves, sleep spindles (12 to 14 Hz), maximal frontocentral region. Hyperventilation and photic stimulation were not performed.   IMPRESSION: This study is within normal limits. No seizures or epileptiform discharges were seen throughout the recording. A normal interictal EEG does not exclude the  diagnosis of epilepsy. Charlsie Quest   MR Brain W and Wo Contrast Result Date: 02/11/2023 CLINICAL DATA:  Seizure, new onset, no history of trauma. EXAM: MRI HEAD WITHOUT AND WITH CONTRAST TECHNIQUE: Multiplanar, multiecho pulse sequences of the brain and surrounding structures were obtained without and with intravenous contrast. CONTRAST:  10mL GADAVIST GADOBUTROL 1 MMOL/ML IV SOLN COMPARISON:  Head CT February 10, 2023. FINDINGS: Brain: No acute infarction, hemorrhage, hydrocephalus or extra-axial collection. Small amount of scattered foci of T2 hyperintensity are seen within the white matter of the cerebral hemispheres, nonspecific. Hypoenhancing pituitary lesion with suprasellar extension measuring approximately 1.6 x 1.4 x 0.9 cm. No significant mass effect on the optic chiasm or hypothalamus. Cavernous sinuses appear preserved. Vascular: Normal flow voids. Skull and upper cervical spine: Normal marrow signal. Sinuses/Orbits: Negative. Other: None. IMPRESSION: 1. No acute intracranial abnormality. 2. 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 3. Small amount of scattered foci of T2 hyperintensity within the white matter of the cerebral hemispheres, nonspecific but may represent mild chronic microvascular ischemic changes. Electronically Signed   By: Baldemar Lenis M.D.   On: 02/11/2023 10:53   CT ANGIO HEAD NECK W WO CM Result Date: 02/11/2023 CLINICAL DATA:  Left-sided weakness and dysmetria EXAM: CT ANGIOGRAPHY HEAD AND NECK WITH AND WITHOUT CONTRAST TECHNIQUE: Multidetector CT imaging of the head and neck was performed using the standard protocol during bolus administration of intravenous contrast. Multiplanar CT image reconstructions and MIPs were obtained to evaluate the vascular anatomy. Carotid stenosis measurements (when applicable) are obtained utilizing NASCET criteria, using the distal  internal carotid diameter as the denominator. RADIATION DOSE REDUCTION: This exam was performed according to the departmental dose-optimization program which includes automated exposure control, adjustment of the mA and/or kV according to patient size and/or use of iterative reconstruction technique. CONTRAST:  75mL OMNIPAQUE IOHEXOL 350 MG/ML SOLN COMPARISON:  None Available. FINDINGS: CTA NECK FINDINGS Skeleton: No acute abnormality or high grade bony spinal canal stenosis. Other neck: Normal pharynx, larynx and major salivary glands. No cervical lymphadenopathy. Unremarkable thyroid gland. Upper chest: No pneumothorax or pleural effusion. No nodules or masses. Aortic arch: There is calcific atherosclerosis of the aortic  arch. Conventional 3 vessel aortic branching pattern. RIGHT carotid system: No dissection, occlusion or aneurysm. Mild atherosclerotic calcification at the carotid bifurcation without hemodynamically significant stenosis. LEFT carotid system: Normal without aneurysm, dissection or stenosis. Vertebral arteries: Left dominant configuration. There is no dissection, occlusion or flow-limiting stenosis to the skull base (V1-V3 segments). CTA HEAD FINDINGS POSTERIOR CIRCULATION: Vertebral arteries are normal. No proximal occlusion of the anterior or inferior cerebellar arteries. Basilar artery is normal. Superior cerebellar arteries are normal. Posterior cerebral arteries are normal. ANTERIOR CIRCULATION: Intracranial internal carotid arteries are normal. Anterior cerebral arteries are normal. Middle cerebral arteries are normal. Venous sinuses: As permitted by contrast timing, patent. Anatomic variants: None Review of the MIP images confirms the above findings. IMPRESSION: 1. No emergent large vessel occlusion or hemodynamically significant stenosis of the head or neck. Aortic Atherosclerosis (ICD10-I70.0). Electronically Signed   By: Deatra Robinson M.D.   On: 02/11/2023 03:33   CT CERVICAL SPINE WO  CONTRAST Result Date: 02/11/2023 CLINICAL DATA:  Neck trauma, dangerous injury mechanism (Age 55-64y) EXAM: CT CERVICAL SPINE WITHOUT CONTRAST TECHNIQUE: Multidetector CT imaging of the cervical spine was performed without intravenous contrast. Multiplanar CT image reconstructions were also generated. RADIATION DOSE REDUCTION: This exam was performed according to the departmental dose-optimization program which includes automated exposure control, adjustment of the mA and/or kV according to patient size and/or use of iterative reconstruction technique. COMPARISON:  None Available. FINDINGS: Alignment: Normal Skull base and vertebrae: No acute fracture. No primary bone lesion or focal pathologic process. Soft tissues and spinal canal: No prevertebral fluid or swelling. No visible canal hematoma. Disc levels: Diffuse degenerative disc disease with disc space narrowing and spurring. Moderate bilateral degenerative facet disease, left greater than right. Upper chest: No acute findings Other: None IMPRESSION: Degenerative disc and facet disease.  No acute bony abnormality. Electronically Signed   By: Charlett Nose M.D.   On: 02/11/2023 00:14   CT HEAD WO CONTRAST Result Date: 02/11/2023 CLINICAL DATA:  Mental status changes EXAM: CT HEAD WITHOUT CONTRAST TECHNIQUE: Contiguous axial images were obtained from the base of the skull through the vertex without intravenous contrast. RADIATION DOSE REDUCTION: This exam was performed according to the departmental dose-optimization program which includes automated exposure control, adjustment of the mA and/or kV according to patient size and/or use of iterative reconstruction technique. COMPARISON:  None Available. FINDINGS: Brain: No acute intracranial abnormality. Specifically, no hemorrhage, hydrocephalus, mass lesion, acute infarction, or significant intracranial injury. Vascular: No hyperdense vessel or unexpected calcification. Skull: No acute calvarial abnormality.  Sinuses/Orbits: No acute findings Other: None IMPRESSION: No acute intracranial abnormality. Electronically Signed   By: Charlett Nose M.D.   On: 02/11/2023 00:13     Subjective: Pt says he feels back to his baseline.  He is eager to go home.    Discharge Exam: Vitals:   02/11/23 1009 02/11/23 1213  BP: 112/69 114/79  Pulse: 67 64  Resp: 18   Temp: 98.1 F (36.7 C) 98.1 F (36.7 C)  SpO2: 96% 99%   Vitals:   02/11/23 0340 02/11/23 0810 02/11/23 1009 02/11/23 1213  BP: 126/79 114/76 112/69 114/79  Pulse: 65 68 67 64  Resp: 16 18 18    Temp: 97.7 F (36.5 C) 97.9 F (36.6 C) 98.1 F (36.7 C) 98.1 F (36.7 C)  TempSrc: Oral Oral Oral Oral  SpO2: 98% 97% 96% 99%  Weight: 106.5 kg     Height: 5\' 11"  (1.803 m)       General: Pt  is alert, awake, not in acute distress Cardiovascular: normal S1/S2 +, no rubs, no gallops Respiratory: CTA bilaterally, no wheezing, no rhonchi Abdominal: Soft, NT, ND, bowel sounds + Extremities: no edema, no cyanosis Neurological: nonfocal exam.    The results of significant diagnostics from this hospitalization (including imaging, microbiology, ancillary and laboratory) are listed below for reference.     Microbiology: No results found for this or any previous visit (from the past 240 hours).   Labs: BNP (last 3 results) No results for input(s): "BNP" in the last 8760 hours. Basic Metabolic Panel: Recent Labs  Lab 02/10/23 2327  NA 135  K 3.8  CL 103  CO2 23  GLUCOSE 202*  BUN 19  CREATININE 0.56*  CALCIUM 8.7*   Liver Function Tests: Recent Labs  Lab 02/10/23 2327  AST 17  ALT 23  ALKPHOS 75  BILITOT 1.4*  PROT 6.6  ALBUMIN 4.0   No results for input(s): "LIPASE", "AMYLASE" in the last 168 hours. No results for input(s): "AMMONIA" in the last 168 hours. CBC: Recent Labs  Lab 02/10/23 2327  WBC 10.9*  NEUTROABS 8.8*  HGB 14.6  HCT 42.1  MCV 90.9  PLT 188   Cardiac Enzymes: Recent Labs  Lab 02/11/23 0547   CKTOTAL 80   BNP: Invalid input(s): "POCBNP" CBG: Recent Labs  Lab 02/10/23 2256 02/11/23 0720 02/11/23 1126 02/11/23 1644  GLUCAP 146* 88 82 97   D-Dimer No results for input(s): "DDIMER" in the last 72 hours. Hgb A1c Recent Labs    02/11/23 0445  HGBA1C 6.8*   Lipid Profile Recent Labs    02/11/23 0547  CHOL 84  HDL 38*  LDLCALC 33  TRIG 63  CHOLHDL 2.2   Thyroid function studies No results for input(s): "TSH", "T4TOTAL", "T3FREE", "THYROIDAB" in the last 72 hours.  Invalid input(s): "FREET3" Anemia work up No results for input(s): "VITAMINB12", "FOLATE", "FERRITIN", "TIBC", "IRON", "RETICCTPCT" in the last 72 hours. Urinalysis    Component Value Date/Time   COLORURINE YELLOW 02/11/2023 0027   APPEARANCEUR CLEAR 02/11/2023 0027   LABSPEC 1.036 (H) 02/11/2023 0027   PHURINE 6.0 02/11/2023 0027   GLUCOSEU >=500 (A) 02/11/2023 0027   HGBUR MODERATE (A) 02/11/2023 0027   BILIRUBINUR NEGATIVE 02/11/2023 0027   KETONESUR NEGATIVE 02/11/2023 0027   PROTEINUR NEGATIVE 02/11/2023 0027   UROBILINOGEN 0.2 10/16/2013 1006   NITRITE NEGATIVE 02/11/2023 0027   LEUKOCYTESUR NEGATIVE 02/11/2023 0027   Sepsis Labs Recent Labs  Lab 02/10/23 2327  WBC 10.9*   Microbiology No results found for this or any previous visit (from the past 240 hours).  Time coordinating discharge:   SIGNED:  Standley Dakins, MD  [facility] 02/11/2023, 4:58 PM How to contact the [facility] Attending or Consulting provider 7A - 7P or covering provider during after hours 7P -7A, for this patient?  Check the care team in [facility] and look for a) attending/consulting [facility] provider listed and b) the [facility] team listed Log into [email or web address] and use [place]'s universal password to access. If you do not have the password, please contact the hospital operator. Locate the [facility] provider you are looking for under [facility] and page to a number that you can be directly reached. If you still  have difficulty reaching the provider, please page the [facility] (Director on Call) for the Hospitalists listed on amion for assistance.

## 2023-02-11 NOTE — ED Notes (Signed)
Patient reports dizziness is better

## 2023-02-11 NOTE — Hospital Course (Signed)
58 y.o. male with hx of diabetes, hypertension, HLD, left bundle branch block, OA, lumbar stenosis, who was brought in by EMS after being found down in the bathroom disoriented with left-sided weakness and slurred speech.  History is provided by patient and then supplemented by son and wife at the bedside.  Patient had initially been more encephalopathic and slow to respond but appears improving by time of my interview.  He reports and family confirms LKWT actually 9 to 9:30 PM.  Patient reports he was getting ready to go to bed he suddenly began to feel dizzy and disoriented, nauseous.  He got up to go to the bathroom and then did his symptoms yelled out for his wife.  His son and wife found him on the ground.  He was disoriented, unable to recognize his son.  He had slurred speech.  Per son he had weakness on the left side of his body unable to move his left leg or stand.  He had shaking in the right arm.  Patient reports he noted having left arm numbness and feeling like left leg was heavier/weak.  No numbness or weakness on the right side.  Speech is come back towards his normal, no longer slurred.  Denies visual changes.  No recent falls or head injuries prior to today.  No history of stroke or seizure in the past.  Wife gave him aspirin 325 mg at home.  Wife is a retired Charity fundraiser actually used to work at [employer]

## 2023-02-11 NOTE — Consult Note (Signed)
[facility] Tele[facility]rology Consult Services  Stat Consult  Patient Name:   Brian Melton, Brian Melton Date of Birth:   10-13-1965 Identification Number:   MRN - [id number] Date of Service:   02/11/2023 01:05:58  Diagnosis:       G93.40 - Encephalopathy unspecified       R26.81 - Unsteady gait  Impression 58 yo M who presented for evaluation of acute onset of altered mental status, dizziness, word-finding difficulties, unsteady gait, and transient LUE numbness. His symptoms improved, and his neurologic exam was grossly non-focal. Differential diagnosis may include a more diffuse/generalized process (e.g., metabolic encephalopathy, medication side effect, etc.) vs seizure with post-ictal encephalopathy vs less likely stroke/TIA. As metabolic workup thus far has been fairly unremarkable, will recommend EEG and MRI brain w/wo contrast for further evaluation.   Recommendations: Our recommendations are outlined below.  1) Continue ASA 81 mg daily for stroke prevention pending below workup 2) Routine EEG to assess for underlying seizure disorder 3) Routine MRI brain w/wo contrast to evaluate for acute stroke, seizure focus, and other acute CNS etiologies 4) Further metabolic/infectious workup as indicated per primary team  Neurology will follow.  ----------------------------------------------------------------------------------------------------   Metrics: Dispatch Time: 02/11/2023 01:03:38 Callback Response Time: 02/11/2023 01:06:50  Primary Provider Notified of Diagnostic Impression and Management Plan on: 02/11/2023 01:39:44   CT HEAD: As Per Radiologist CT Head Showed No Acute Hemorrhage or Acute Core Infarct   ----------------------------------------------------------------------------------------------------  Chief Complaint: altered mental status, unsteady gait, dizziness, and LUE numbness  History of Present Illness: Patient is a 58 year old Male. Patient  was last known normal around 7 PM this evening. He was later found on the bathroom floor by family to be confused and "not talking right". He was disoriented, had word-finding difficulties, and could not recognize his son. Family attempted to get him up and noted that he had an unsteady gait too. He complained of vague dizziness and nausea as well. Patient does not recall the events in detail and is uncertain if he experienced any loss of consciousness. On arrival to the ED, he was also noted to have LUE numbness. At the time of my evaluation, patient's symptoms had significantly improved with his spouse only noting that his speech was still slower than normal.   Past Medical History:      Hypertension      Diabetes Mellitus      Hyperlipidemia  Medications:  No Anticoagulant use  Antiplatelet use: Yes ASA (prior to arrival) Reviewed EMR for current medications  Allergies:  Reviewed Description: PCN  Social History: Smoking: No Alcohol Use: No Drug Use: No  Family History:  There is no family history of premature cerebrovascular disease pertinent to this consultation  ROS : 14 Points Review of Systems was performed and was negative except mentioned in HPI.  Past Surgical History: There Is No Surgical History Contributory To Today's Visit   Examination: BP(124/74), Pulse(65), 1A: Level of Consciousness - Alert; keenly responsive + 0 1B: Ask Month and Age - Both Questions Right + 0 1C: Blink Eyes & Squeeze Hands - Performs Both Tasks + 0 2: Test Horizontal Extraocular Movements - Normal + 0 3: Test Visual Fields - No Visual Loss + 0 4: Test Facial Palsy (Use Grimace if Obtunded) - Normal symmetry + 0 5A: Test Left Arm Motor Drift - No Drift for 10 Seconds + 0 5B: Test Right Arm Motor Drift - No Drift for 10 Seconds + 0 6A: Test Left Leg Motor Drift - No  Drift for 5 Seconds + 0 6B: Test Right Leg Motor Drift - No Drift for 5 Seconds + 0 7: Test Limb Ataxia (FNF/Heel-Shin) -  No Ataxia + 0 8: Test Sensation - Normal; No sensory loss + 0 9: Test Language/Aphasia - Normal; No aphasia + 0 10: Test Dysarthria - Normal + 0 11: Test Extinction/Inattention - No abnormality + 0  NIHSS Score: 0 NIHSS Free Text : Bradyphasia/bradyphrenia but no overt aphasia appreciated  Spoke with : Dr. Blinda Leatherwood  This consult was conducted in real time using interactive audio and Immunologist. Patient was informed of the technology being used for this visit and agreed to proceed. Patient located in hospital and provider located at home/office setting.  Patient is being evaluated for possible acute neurologic impairment and high probability of imminent or life - threatening deterioration.I spent total of 30 minutes providing care to this patient, including time for face to face visit via telemedicine, review of medical records, imaging studies and discussion of findings with providers, the patient and / or family.  Dr Jill Alexanders De'Prey  [facility] For Inpatient follow-up with [facility] physician please call [facility] at [phone]. As we are not an outpatient service for any post hospital discharge needs please contact the hospital for assistance.  If you have any questions for the [facility] physicians or need to reconsult for clinical or diagnostic changes please contact us via [facility] at [phone].

## 2023-02-11 NOTE — ED Notes (Signed)
Teleneuro consult with Dr. Nathanial Millman

## 2023-02-15 DIAGNOSIS — G459 Transient cerebral ischemic attack, unspecified: Secondary | ICD-10-CM | POA: Diagnosis not present

## 2023-02-15 DIAGNOSIS — D352 Benign neoplasm of pituitary gland: Secondary | ICD-10-CM | POA: Diagnosis not present

## 2023-02-15 DIAGNOSIS — E119 Type 2 diabetes mellitus without complications: Secondary | ICD-10-CM | POA: Diagnosis not present

## 2023-02-15 DIAGNOSIS — M5030 Other cervical disc degeneration, unspecified cervical region: Secondary | ICD-10-CM | POA: Diagnosis not present

## 2023-03-15 DIAGNOSIS — G459 Transient cerebral ischemic attack, unspecified: Secondary | ICD-10-CM | POA: Diagnosis not present

## 2023-03-15 DIAGNOSIS — E1169 Type 2 diabetes mellitus with other specified complication: Secondary | ICD-10-CM | POA: Diagnosis not present

## 2023-03-15 DIAGNOSIS — E7800 Pure hypercholesterolemia, unspecified: Secondary | ICD-10-CM | POA: Diagnosis not present

## 2023-03-15 DIAGNOSIS — I10 Essential (primary) hypertension: Secondary | ICD-10-CM | POA: Diagnosis not present

## 2023-04-12 ENCOUNTER — Encounter: Payer: Self-pay | Admitting: Neurology

## 2023-04-12 ENCOUNTER — Ambulatory Visit: Payer: BC Managed Care – PPO | Admitting: Neurology

## 2023-04-12 VITALS — BP 138/82 | HR 71 | Ht 71.0 in | Wt 239.5 lb

## 2023-04-12 DIAGNOSIS — D352 Benign neoplasm of pituitary gland: Secondary | ICD-10-CM | POA: Diagnosis not present

## 2023-04-12 DIAGNOSIS — R55 Syncope and collapse: Secondary | ICD-10-CM

## 2023-04-12 NOTE — Progress Notes (Signed)
 [facility]  PATIENT: Brian Melton DOB: [birth year]  REQUESTING CLINICIAN: Cleora Fleet, MD HISTORY FROM: Patient, spouse and chart review  REASON FOR VISIT: Loss of consciousness    HISTORICAL  CHIEF COMPLAINT:  Chief Complaint  Patient presents with   New Patient (Initial Visit)    Rm 13, wife.  Jan 2025 L side weakness, [place]     HISTORY OF PRESENT ILLNESS:  This is a 58 year old gentleman past medical history of hypertension, hyperlipidemia, heart disease, obesity who is presenting after being admitted to the hospital in January for an episode of syncope, left-sided weakness and slurred speech.  Patient tells me that he remember getting ready for bed, then felt nauseous and from that he is memory was not clear.  Wife tells me that she found him in the bathroom floor, collapsed with difficulty getting up.  They called his son to help but he could not recognize his son.  EMS was called.  EMS had to help patient outside the house because he was too weak to walk.  In the hospital he was outside the tPA window and his NIH score was 0.  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  He had a routine EEG did not show any epileptiform discharges.  Patient was back to his normal self and he was discharged home the next day.  Tells me since being home, has been back to his normal self.  He has not had any additional events.  He is compliant with his medications. Never had a similar episode in the past.    Brief Hospital Summary: 58 y.o. male with hx of diabetes, hypertension, HLD, left bundle branch block, OA, lumbar stenosis, who was brought in by EMS after being found down in the bathroom disoriented with left-sided weakness and slurred speech.  History is provided by patient and then supplemented by son and wife at the bedside.  Patient had initially been more encephalopathic and slow to respond but appears improving  by time of my interview.  He reports and family confirms LKWT actually 9 to 9:30 PM.  Patient reports he was getting ready to go to bed he suddenly began to feel dizzy and disoriented, nauseous.  He got up to go to the bathroom and then did his symptoms yelled out for his wife.  His son and wife found him on the ground.  He was disoriented, unable to recognize his son.  He had slurred speech.  Per son he had weakness on the left side of his body unable to move his left leg or stand.  He had shaking in the right arm.  Patient reports he noted having left arm numbness and feeling like left leg was heavier/weak.  No numbness or weakness on the right side.  Speech is come back towards his normal, no longer slurred.  Denies visual changes.  No recent falls or head injuries prior to today.  No history of stroke or seizure in the past.  Wife gave him aspirin 325 mg at home.  Wife is a retired Charity fundraiser actually used to work at [employer]  Pt was admitted with acute neurological changes for full stroke vs TIA work up.  He was seen by teleneurologist who recommended daily aspirin 81 mg for secondary stroke prevention.  He also recommended a routine EEG to assess for underlying seizure disorder.  The EEG was completed and was  read by neurologist as negative for epileptic activity.  The patient also went to have a MRI brain with and without contrast which was negative for acute stroke.  His other workup has been reassuring.  He is feeling back to his normal self at baseline and eager to go home.  Will make a referral for him to follow-up with neurology in the outpatient setting.  He is discharging home in stable condition.  OTHER MEDICAL CONDITIONS: Hypertension, hyperlipidemia, heart disease, obesity   REVIEW OF SYSTEMS: Full 14 system review of systems performed and negative with exception of: As noted in the HPI    ALLERGIES: Allergies  Allergen Reactions   Penicillins     UNSPECIFIED CHILDHOOD  REACTION     HOME MEDICATIONS: Outpatient Medications Prior to Visit  Medication Sig Dispense Refill   aspirin 81 MG chewable tablet Chew 1 tablet (81 mg total) by mouth daily.     aspirin-sod bicarb-citric acid (ALKA-SELTZER) 325 MG TBEF tablet Take 325 mg by mouth every 6 (six) hours as needed (Pain).     atorvastatin (LIPITOR) 10 MG tablet Take 10 mg by mouth at bedtime.      dapagliflozin propanediol (FARXIGA) 10 MG TABS tablet Take 10 mg by mouth daily.     glipiZIDE (GLUCOTROL XL) 5 MG 24 hr tablet Take 5 mg by mouth in the morning and at bedtime.     losartan (COZAAR) 100 MG tablet Take 100 mg by mouth daily.     meloxicam (MOBIC) 7.5 MG tablet Take 7.5 mg by mouth 2 (two) times daily.     metFORMIN (GLUCOPHAGE-XR) 500 MG 24 hr tablet Take 1,000 mg by mouth 2 (two) times daily.     metoprolol succinate (TOPROL-XL) 25 MG 24 hr tablet Take 1 tablet (25 mg total) by mouth daily. Must call [phone] to schedule an appt to continue refills. Thank you. 1st attempt 30 tablet 0   pioglitazone (ACTOS) 45 MG tablet Take 45 mg by mouth daily.     Semaglutide (RYBELSUS) 7 MG TABS Take 7 tablets by mouth daily.     No facility-administered medications prior to visit.    PAST MEDICAL HISTORY: Past Medical History:  Diagnosis Date   Arthritis    Cancer (HCC)    skin cancer   Diabetes mellitus without complication (HCC)    Heart murmur    RBBB   History of kidney stones    Pneumonia    Sleep apnea    not using cpap at this time.   Umbilical hernia     PAST SURGICAL HISTORY: Past Surgical History:  Procedure Laterality Date   BACK SURGERY     COLONOSCOPY N/A 11/22/2015   Procedure: COLONOSCOPY;  Surgeon: Malissa Hippo, MD;  Location: [facility];  Service: Endoscopy;  Laterality: N/A;  12:15   ear drum surgery     infant.   TONSILLECTOMY     TOTAL HIP ARTHROPLASTY Right 05/24/2021   Procedure: TOTAL HIP ARTHROPLASTY ANTERIOR APPROACH;  Surgeon: Ollen Gross, MD;  Location:  [facility];  Service: Orthopedics;  Laterality: Right;    FAMILY HISTORY: Family History  Problem Relation Age of Onset   Dementia Mother    Seizures Mother    Diabetes Mother    Bell's palsy Mother    Diabetes Sister     SOCIAL HISTORY: Social History   Socioeconomic History   Marital status: Married    Spouse name: Not on file   Number of children: Not on file  Years of education: Not on file   Highest education level: Not on file  Occupational History   Not on file  Tobacco Use   Smoking status: Never   Smokeless tobacco: Current    Types: Chew  Vaping Use   Vaping status: Never Used  Substance and Sexual Activity   Alcohol use: Yes    Comment: rare   Drug use: No   Sexual activity: Not on file  Other Topics Concern   Not on file  Social History Narrative   Caffiene coffee 1 cup, tea 1-2 cup, soda diet caff free    Live wife, mother in law, 4 dogs, 2 cats   Working: [employer] ([employer]) works out floor some and paperwork   Social Drivers of Corporate investment banker Strain: Not on file  Food Insecurity: No Food Insecurity (02/11/2023)   Hunger Vital Sign    Worried About Running Out of Food in the Last Year: Never true    Ran Out of Food in the Last Year: Never true  Transportation Needs: No Transportation Needs (02/11/2023)   PRAPARE - Administrator, Civil Service (Medical): No    Lack of Transportation (Non-Medical): No  Physical Activity: Not on file  Stress: Not on file  Social Connections: Unknown (06/05/2021)   [facility]   Social Network    Social Network: Not on file  Intimate Partner Violence: Not At Risk (02/11/2023)   Humiliation, Afraid, Rape, and Kick questionnaire    Fear of Current or Ex-Partner: No    Emotionally Abused: No    Physically Abused: No    Sexually Abused: No    PHYSICAL EXAM  GENERAL EXAM/CONSTITUTIONAL: Vitals:  Vitals:   04/12/23 1106  BP: 138/82  Pulse: 71  Weight: 239 lb  8 oz (108.6 kg)  Height: 5\' 11"  (1.803 m)   Body mass index is 33.4 kg/m. Wt Readings from Last 3 Encounters:  04/12/23 239 lb 8 oz (108.6 kg)  02/11/23 234 lb 12.6 oz (106.5 kg)  05/24/21 236 lb 8 oz (107.3 kg)   Patient is in no distress; well developed, nourished and groomed; neck is supple  MUSCULOSKELETAL: Gait, strength, tone, movements noted in Neurologic exam below  NEUROLOGIC: MENTAL STATUS:      No data to display         awake, alert, oriented to person, place and time recent and remote memory intact normal attention and concentration language fluent, comprehension intact, naming intact fund of knowledge appropriate  CRANIAL NERVE:  2nd, 3rd, 4th, 6th - Visual fields full to confrontation, extraocular muscles intact, no nystagmus 5th - facial sensation symmetric 7th - facial strength symmetric 8th - hearing intact 9th - palate elevates symmetrically, uvula midline 11th - shoulder shrug symmetric 12th - tongue protrusion midline  MOTOR:  normal bulk and tone, full strength in the BUE, BLE  SENSORY:  normal and symmetric to light touch  COORDINATION:  finger-nose-finger, fine finger movements normal  REFLEXES:  deep tendon reflexes present and symmetric  GAIT/STATION:  normal   DIAGNOSTIC DATA (LABS, IMAGING, TESTING) - I reviewed patient records, labs, notes, testing and imaging myself where available.  Lab Results  Component Value Date   WBC 10.9 (H) 02/10/2023   HGB 14.6 02/10/2023   HCT 42.1 02/10/2023   MCV 90.9 02/10/2023   PLT 188 02/10/2023      Component Value Date/Time   NA 135 02/10/2023 2327   K 3.8 02/10/2023 2327  CL 103 02/10/2023 2327   CO2 23 02/10/2023 2327   GLUCOSE 202 (H) 02/10/2023 2327   BUN 19 02/10/2023 2327   CREATININE 0.56 (L) 02/10/2023 2327   CALCIUM 8.7 (L) 02/10/2023 2327   PROT 6.6 02/10/2023 2327   ALBUMIN 4.0 02/10/2023 2327   AST 17 02/10/2023 2327   ALT 23 02/10/2023 2327   ALKPHOS 75  02/10/2023 2327   BILITOT 1.4 (H) 02/10/2023 2327   GFRNONAA >60 02/10/2023 2327   GFRAA >60 10/22/2017 1054   Lab Results  Component Value Date   CHOL 84 02/11/2023   HDL 38 (L) 02/11/2023   LDLCALC 33 02/11/2023   TRIG 63 02/11/2023   CHOLHDL 2.2 02/11/2023   Lab Results  Component Value Date   HGBA1C 6.8 (H) 02/11/2023   No results found for: "VITAMINB12" No results found for: "TSH"  EEG 02/11/2023 This study is within normal limits. No seizures or epileptiform discharges were seen throughout the recording. A normal interictal EEG does not exclude the diagnosis of epilepsy.  MRI Brain 02/11/2023 1. No acute intracranial abnormality. 2. 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 3. Small amount of scattered foci of T2 hyperintensity within the white matter of the cerebral hemispheres, nonspecific but may represent mild chronic microvascular ischemic changes.  CTA Head and Neck 02/11/2023 1. No emergent large vessel occlusion or hemodynamically significant stenosis of the head or neck.   ASSESSMENT AND PLAN  58 y.o. year old male with history of hypertension, hyperlipidemia, heart disease, obesity who is presenting after being admitted to hospital for an episode of syncope, generalized weakness. His workup did not show any acute stroke and no evidence of increased epileptogenic potential.  He is back to his normal self.  At this point, unclear reason of the event, but wife tells me at that time he was drinking a lot of energy drink, unclear if it is related but since then he has cut the amount of energy drinks and has been doing well since.  Plan will be to obtain an ambulatory EEG to rule out seizures because it is still high in the differential.  I will contact him to go over the results otherwise I will see him in 6 months for follow-up or sooner if worse.   1. Syncope, unspecified syncope  type   2. Pituitary adenoma [facility])      Patient Instructions  Will check prolactin level based on new finding of pituitary adenoma Continue current medications  3 days ambulatory EEG. I will contact you to go over the results  Return if worse or any other concerns.   Orders Placed This Encounter  Procedures   Prolactin   AMBULATORY EEG    No orders of the defined types were placed in this encounter.   Return if symptoms worsen or fail to improve.    Windell Norfolk, MD 04/12/2023, 1:56 PM  [facility] [address], [address] [place], [place] [address] [phone]

## 2023-04-12 NOTE — Patient Instructions (Addendum)
 Will check prolactin level based on new finding of pituitary adenoma Continue current medications  3 days ambulatory EEG. I will contact you to go over the results  Return if worse or any other concerns.

## 2023-04-13 LAB — PROLACTIN: Prolactin: 15.7 ng/mL (ref 3.6–25.2)

## 2023-04-15 ENCOUNTER — Telehealth: Payer: Self-pay

## 2023-04-15 ENCOUNTER — Encounter: Payer: Self-pay | Admitting: Neurology

## 2023-05-06 DIAGNOSIS — R42 Dizziness and giddiness: Secondary | ICD-10-CM | POA: Diagnosis not present

## 2023-05-06 DIAGNOSIS — R55 Syncope and collapse: Secondary | ICD-10-CM

## 2023-05-06 DIAGNOSIS — R4701 Aphasia: Secondary | ICD-10-CM

## 2023-05-06 DIAGNOSIS — R258 Other abnormal involuntary movements: Secondary | ICD-10-CM | POA: Diagnosis not present

## 2023-05-27 ENCOUNTER — Other Ambulatory Visit (INDEPENDENT_AMBULATORY_CARE_PROVIDER_SITE_OTHER): Payer: Self-pay | Admitting: Neurology

## 2023-05-27 DIAGNOSIS — R55 Syncope and collapse: Secondary | ICD-10-CM

## 2023-05-27 NOTE — Procedures (Signed)
 Clinical History:   This is a 58 year old M pmh of hypertension, hyperlipidemia, heart disease, obesity who is presenting after being admitted to the hospital in January for an episode of syncope, left sided weakness and slurred speech. EEG to evaluate for seizure activity.   INTERMITTENT MONITORING with VIDEO TECHNICAL SUMMARY: This AVEEG was performed using equipment provided by [employer] utilizing Bluetooth ( [employer] ) amplifiers with continuous EEGT attended video collection using encrypted remote transmission via [employer] secured cellular tower network with data rates for each AVEEG performed. This is a Therapist, music AVEEG, obtained, according to the 10-20 international electrode placement system, reformatted digitally into referential and bipolar montages. Data was acquired with a minimum of 21 bipolar connections and sampled at a minimum rate of 250 cycles per second per channel, maximum rate of 450 cycles per second per channel and two channels for EKG. The entire VEEG study was recorded through cable and or radio telemetry for subsequent analysis. Specified epochs of the AVEEG data were identified at the direction of the subject by the depression of a push button by the patient. Each patients event file included data acquired two minutes prior to the push button activation and continuing until two minutes afterwards. AVEEG files were reviewed on [employer] [facility] server, Licensed Software provided by [employer] with a digital high frequency filter set at 70 Hz and a low frequency filter set at 1 Hz with a paper speed of 90mm/s resulting in 10 seconds per digital page. This entire AVEEG was reviewed by the EEG Technologist. Random time samples, random sleep samples, clips, patient initiated push button files with included patient daily diary logs, EEG Technologist pruned data was reviewed and verified for accuracy and validity by the governing reading neurologist in full details.  This AEEGV was fully compliant with all requirements for CPT 97500 for setup, patient education, take down and administered by an EEG technologist.   Long-Term EEG with Video was monitored intermittently by a qualified EEG technologist for the entirety of the recording; quality check-ins were performed at a minimum of every two hours, checking and documenting real-time data and video to assure the integrity and quality of the recording (e.g., camera position, electrode integrity and impedance), and identify the need for maintenance. For intermittent monitoring, an EEG Technologist monitored no more than 12 patients concurrently. Diagnostic video was captured at least 80% of the time during the recording.   PATIENT EVENTS:  There were no patient events noted or captured during this recording.   TECHNOLOGIST EVENTS:  No clear epileptiform activity was detected by the reviewing neurodiagnostic technologist for further review.   TIME SAMPLES:  10-minutes of every 2 hours recorded are reviewed as random time samples.   SLEEP SAMPLES:  5-minutes of every 24 hours recorded are reviewed as random sleep samples.   AWAKE:  At maximal level of alertness, the posterior dominant background activity was continuous, reactive, low voltage rhythm of 9 Hz. This was symmetric, well-modulated, and attenuated with eye opening. Diffuse, symmetric, frontocentral beta range activity was present.   SLEEP:  N1 Sleep (Stage 1) was observed and characterized by the disappearance of alpha rhythm and the appearance of vertex activity.  N2 Sleep (Stage 2) was observed and characterized by vertex waves, K-complexes, and sleep spindles.   N3 (Stage 3) sleep was observed and characterized by high amplitude Delta activity of 20%.   REM sleep was observed.   EKG:  There were no arrhythmias or abnormalities noted during this recording.  Impression:  Normal EEG: awake and asleep   Clinical Correlation:  This is a  normal 3-day ambulatory EEG tracing. No focal abnormalities or epileptiform discharges were seen. There were no electrographic seizures noted. No events were captured during the recording. Please note a normal EEG does not exclude the diagnosis of epilepsy.  Jaece Ducharme, MD [facility]

## 2023-05-29 ENCOUNTER — Encounter: Payer: Self-pay | Admitting: Neurology

## 2023-06-07 DIAGNOSIS — E7800 Pure hypercholesterolemia, unspecified: Secondary | ICD-10-CM | POA: Diagnosis not present

## 2023-06-07 DIAGNOSIS — D352 Benign neoplasm of pituitary gland: Secondary | ICD-10-CM | POA: Diagnosis not present

## 2023-06-07 DIAGNOSIS — E1169 Type 2 diabetes mellitus with other specified complication: Secondary | ICD-10-CM | POA: Diagnosis not present

## 2023-06-07 DIAGNOSIS — Z79899 Other long term (current) drug therapy: Secondary | ICD-10-CM | POA: Diagnosis not present

## 2023-06-07 DIAGNOSIS — I10 Essential (primary) hypertension: Secondary | ICD-10-CM | POA: Diagnosis not present

## 2023-07-19 DIAGNOSIS — I10 Essential (primary) hypertension: Secondary | ICD-10-CM | POA: Diagnosis not present

## 2023-07-19 DIAGNOSIS — E1169 Type 2 diabetes mellitus with other specified complication: Secondary | ICD-10-CM | POA: Diagnosis not present

## 2023-09-06 DIAGNOSIS — E119 Type 2 diabetes mellitus without complications: Secondary | ICD-10-CM | POA: Diagnosis not present

## 2023-09-06 DIAGNOSIS — H2513 Age-related nuclear cataract, bilateral: Secondary | ICD-10-CM | POA: Diagnosis not present

## 2023-09-06 DIAGNOSIS — H524 Presbyopia: Secondary | ICD-10-CM | POA: Diagnosis not present

## 2023-09-06 DIAGNOSIS — H52221 Regular astigmatism, right eye: Secondary | ICD-10-CM | POA: Diagnosis not present

## 2023-09-06 DIAGNOSIS — H35033 Hypertensive retinopathy, bilateral: Secondary | ICD-10-CM | POA: Diagnosis not present

## 2023-09-19 DIAGNOSIS — R739 Hyperglycemia, unspecified: Secondary | ICD-10-CM | POA: Diagnosis not present

## 2023-09-19 DIAGNOSIS — Z79899 Other long term (current) drug therapy: Secondary | ICD-10-CM | POA: Diagnosis not present

## 2023-09-19 DIAGNOSIS — E782 Mixed hyperlipidemia: Secondary | ICD-10-CM | POA: Diagnosis not present

## 2023-11-15 DIAGNOSIS — D352 Benign neoplasm of pituitary gland: Secondary | ICD-10-CM | POA: Diagnosis not present

## 2023-11-15 DIAGNOSIS — E1169 Type 2 diabetes mellitus with other specified complication: Secondary | ICD-10-CM | POA: Diagnosis not present

## 2023-11-15 DIAGNOSIS — E119 Type 2 diabetes mellitus without complications: Secondary | ICD-10-CM | POA: Diagnosis not present

## 2023-11-15 DIAGNOSIS — E782 Mixed hyperlipidemia: Secondary | ICD-10-CM | POA: Diagnosis not present

## 2023-11-15 DIAGNOSIS — H35033 Hypertensive retinopathy, bilateral: Secondary | ICD-10-CM | POA: Diagnosis not present

## 2023-11-15 DIAGNOSIS — I10 Essential (primary) hypertension: Secondary | ICD-10-CM | POA: Diagnosis not present

## 2023-11-15 DIAGNOSIS — Z0000 Encounter for general adult medical examination without abnormal findings: Secondary | ICD-10-CM | POA: Diagnosis not present
